# Patient Record
Sex: Male | Born: 1960 | Hispanic: No | Marital: Married | Smoking: Never smoker
Health system: Southern US, Community
[De-identification: ages and names within clinical notes are randomized; demographics above are authoritative.]

## PROBLEM LIST (undated history)

## (undated) DIAGNOSIS — E785 Hyperlipidemia, unspecified: Secondary | ICD-10-CM

## (undated) DIAGNOSIS — H269 Unspecified cataract: Secondary | ICD-10-CM

## (undated) DIAGNOSIS — T7840XA Allergy, unspecified, initial encounter: Secondary | ICD-10-CM

## (undated) DIAGNOSIS — I10 Essential (primary) hypertension: Secondary | ICD-10-CM

## (undated) HISTORY — DX: Unspecified cataract: H26.9

## (undated) HISTORY — DX: Hyperlipidemia, unspecified: E78.5

## (undated) HISTORY — DX: Allergy, unspecified, initial encounter: T78.40XA

## (undated) HISTORY — DX: Essential (primary) hypertension: I10

---

## 2003-06-30 ENCOUNTER — Ambulatory Visit (HOSPITAL_COMMUNITY): Admission: RE | Admit: 2003-06-30 | Discharge: 2003-06-30 | Payer: Self-pay | Admitting: *Deleted

## 2012-06-26 DIAGNOSIS — N529 Male erectile dysfunction, unspecified: Secondary | ICD-10-CM | POA: Insufficient documentation

## 2013-03-30 DIAGNOSIS — E669 Obesity, unspecified: Secondary | ICD-10-CM | POA: Insufficient documentation

## 2013-03-30 DIAGNOSIS — I10 Essential (primary) hypertension: Secondary | ICD-10-CM | POA: Insufficient documentation

## 2013-03-30 DIAGNOSIS — H35039 Hypertensive retinopathy, unspecified eye: Secondary | ICD-10-CM | POA: Insufficient documentation

## 2013-03-31 DIAGNOSIS — E782 Mixed hyperlipidemia: Secondary | ICD-10-CM | POA: Insufficient documentation

## 2013-04-07 DIAGNOSIS — J309 Allergic rhinitis, unspecified: Secondary | ICD-10-CM | POA: Insufficient documentation

## 2015-05-19 MED FILL — LOSARTAN POTASSIUM 100 MG T: 100 | 30 days supply | Qty: 30 | Fill #1

## 2015-05-19 MED FILL — PRAVASTATIN NA 40 MG TAB: 40 | 90 days supply | Qty: 90 | Fill #1

## 2015-05-19 MED FILL — FENOFIBRATE 145 MG TABLET: 145 | 90 days supply | Qty: 90 | Fill #1

## 2015-06-27 MED FILL — LOSARTAN POTASSIUM 100 MG T: 100 | 90 days supply | Qty: 90 | Fill #0

## 2015-06-27 MED FILL — OMEGA-3 ETHYL ESTERS 1 GM C: 1 | 90 days supply | Qty: 180 | Fill #1

## 2015-06-29 DIAGNOSIS — Z1159 Encounter for screening for other viral diseases: Secondary | ICD-10-CM | POA: Diagnosis not present

## 2015-06-29 DIAGNOSIS — E782 Mixed hyperlipidemia: Secondary | ICD-10-CM | POA: Diagnosis not present

## 2015-06-29 DIAGNOSIS — I10 Essential (primary) hypertension: Secondary | ICD-10-CM | POA: Diagnosis not present

## 2015-06-29 DIAGNOSIS — Z125 Encounter for screening for malignant neoplasm of prostate: Secondary | ICD-10-CM | POA: Diagnosis not present

## 2015-06-29 DIAGNOSIS — H35039 Hypertensive retinopathy, unspecified eye: Secondary | ICD-10-CM | POA: Diagnosis not present

## 2015-06-29 DIAGNOSIS — Z1211 Encounter for screening for malignant neoplasm of colon: Secondary | ICD-10-CM | POA: Diagnosis not present

## 2015-06-29 MED FILL — VIAGRA 100 MG TABLET: 100 | 15 days supply | Qty: 3 | Fill #1

## 2015-06-29 MED FILL — AMLODIPINE BESYLATE 10 MG T: 10 | 90 days supply | Qty: 90 | Fill #0

## 2015-08-03 MED FILL — VIAGRA 100 MG TABLET: 100 | 60 days supply | Qty: 12 | Fill #0

## 2015-09-20 DIAGNOSIS — I10 Essential (primary) hypertension: Secondary | ICD-10-CM | POA: Diagnosis not present

## 2015-09-20 DIAGNOSIS — E782 Mixed hyperlipidemia: Secondary | ICD-10-CM | POA: Diagnosis not present

## 2015-09-20 DIAGNOSIS — J301 Allergic rhinitis due to pollen: Secondary | ICD-10-CM | POA: Diagnosis not present

## 2015-09-20 DIAGNOSIS — N529 Male erectile dysfunction, unspecified: Secondary | ICD-10-CM | POA: Diagnosis not present

## 2015-09-20 DIAGNOSIS — Z1211 Encounter for screening for malignant neoplasm of colon: Secondary | ICD-10-CM | POA: Diagnosis not present

## 2015-09-20 MED FILL — ALL DAY ALLERGY 10 MG TAB: 10 | 100 days supply | Qty: 100 | Fill #0

## 2015-09-20 MED FILL — LOSARTAN POTASSIUM 100 MG T: 100 | 90 days supply | Qty: 90 | Fill #0

## 2015-09-20 MED FILL — FENOFIBRATE 145 MG TABLET: 145 | 90 days supply | Qty: 90 | Fill #0

## 2015-09-20 MED FILL — PRAVASTATIN NA 40 MG TAB: 40 | 90 days supply | Qty: 90 | Fill #0

## 2015-09-20 MED FILL — VIAGRA 100 MG TABLET: 100 | 60 days supply | Qty: 12 | Fill #0

## 2016-03-05 MED FILL — LOSARTAN POTASSIUM 100 MG T: 100 | 90 days supply | Qty: 90 | Fill #1

## 2016-03-05 MED FILL — AMLODIPINE BESYLATE 10 MG T: 10 | 90 days supply | Qty: 90 | Fill #1

## 2016-03-18 ENCOUNTER — Ambulatory Visit (INDEPENDENT_AMBULATORY_CARE_PROVIDER_SITE_OTHER): Payer: 59 | Admitting: Family Medicine

## 2016-03-18 ENCOUNTER — Ambulatory Visit (INDEPENDENT_AMBULATORY_CARE_PROVIDER_SITE_OTHER): Payer: 59

## 2016-03-18 VITALS — BP 152/94 | HR 84 | Temp 98.4°F | Resp 18 | Ht 64.0 in | Wt 179.0 lb

## 2016-03-18 DIAGNOSIS — R05 Cough: Secondary | ICD-10-CM

## 2016-03-18 DIAGNOSIS — R911 Solitary pulmonary nodule: Secondary | ICD-10-CM

## 2016-03-18 DIAGNOSIS — J40 Bronchitis, not specified as acute or chronic: Secondary | ICD-10-CM | POA: Diagnosis not present

## 2016-03-18 DIAGNOSIS — R053 Chronic cough: Secondary | ICD-10-CM

## 2016-03-18 DIAGNOSIS — R0602 Shortness of breath: Secondary | ICD-10-CM | POA: Diagnosis not present

## 2016-03-18 MED ORDER — HYDROCOD POLST-CPM POLST ER 10-8 MG/5ML PO SUER: 5.0000 mL | Freq: Two times a day (BID) | ORAL | 0 refills | Status: DC | PRN

## 2016-03-18 MED ORDER — AMOXICILLIN-POT CLAVULANATE 875-125 MG PO TABS: 1.0000 | ORAL_TABLET | Freq: Two times a day (BID) | ORAL | 0 refills | Status: DC

## 2016-03-18 MED FILL — HYDROCODONE-CHLORPHEN ER SU: 10-8 | 10 days supply | Qty: 100 | Fill #0

## 2016-03-18 MED FILL — AMOX-CLAV 875-125 MG TABLET: 875-125 | 10 days supply | Qty: 20 | Fill #0

## 2016-03-18 NOTE — Progress Notes (Signed)
Patient ID: Cody Lindsey, male    DOB: [birth year], 56 y.o.   MRN: [id number]  PCP: No primary care provider on file.  Chief Complaint  Patient presents with  . Cough    persistent cough x 1 month. Productive cough just recently     Subjective:  HPI 56 year olds male presents for evaluation of cough x 1 month. Spouse is providing hx of present illness along with husband. Spouse reports that return from a [place] to [place] in early January, pt developed a cough which has progressively worsened over this month. Patient reports at least once annually he develops a really bad cough that lasts for several weeks and he questions whether or he has allergies and would like to follow-up with an Allergists. Wife reports his breathing and wheezing worsens at night to the point, she can hear him coughing and wheezing in another room. He works outside in [employer] and is around outdoor triggers in addition to weather elements. Reports entire body aches over the last few days. Barking cough. He has been taking Thera-Flu, Allegra D, and Flonase nasal spray with minimal relief of symptoms. Increased fatigue due to not sleeping as a result of persistent cough.  Social History   Social History  . Marital status: Married    Spouse name: N/A  . Number of children: N/A  . Years of education: N/A   Occupational History  . Not on file.   Social History Main Topics  . Smoking status: Never Smoker  . Smokeless tobacco: Never Used  . Alcohol use Not on file  . Drug use: Unknown  . Sexual activity: Not on file   Other Topics Concern  . Not on file   Social History Narrative  . No narrative on file   Review of Systems See HPI  Prior to Admission medications   Medication Sig Start Date End Date Taking? Authorizing Provider  amlodipine-atorvastatin (CADUET) 10-10 MG tablet Take 1 tablet by mouth daily.   Yes Historical Provider, MD  aspirin 81 MG chewable tablet Chew by mouth daily.   Yes  Historical Provider, MD  fenofibrate 54 MG tablet Take 54 mg by mouth daily.   Yes Historical Provider, MD  fexofenadine-pseudoephedrine (ALLEGRA-D) 60-120 MG 12 hr tablet Take 1 tablet by mouth 2 (two) times daily.   Yes Historical Provider, MD  losartan (COZAAR) 50 MG tablet Take 50 mg by mouth daily.   Yes Historical Provider, MD  omega-3 acid ethyl esters (LOVAZA) 1 g capsule Take by mouth 2 (two) times daily.   Yes Historical Provider, MD  pravastatin (PRAVACHOL) 10 MG tablet Take 10 mg by mouth daily.   Yes Historical Provider, MD  lisinopril (PRINIVIL,ZESTRIL) 10 MG tablet Take 10 mg by mouth daily.    Historical Provider, MD    Past Medical, Surgical Family and Social History reviewed and updated.    Objective:   Today's Vitals   03/18/16 1450  BP: (!) 166/94  Pulse: 84  Resp: 18  Temp: 98.4 F (36.9 C)  TempSrc: Oral  SpO2: 97%  Weight: 179 lb (81.2 kg)  Height: 5\' 4"  (1.626 m)    Wt Readings from Last 3 Encounters:  03/18/16 179 lb (81.2 kg)   Physical Exam  Constitutional: He is oriented to person, place, and time. He appears well-developed and well-nourished.  HENT:  Head: Normocephalic and atraumatic.  Right Ear: External ear normal.  Left Ear: External ear normal.  Mouth/Throat: Oropharynx is clear and moist.  Eyes: Conjunctivae and EOM are normal. Pupils are equal, round, and reactive to light.  Neck: Normal range of motion. Neck supple.  Cardiovascular: Normal rate, regular rhythm, normal heart sounds and intact distal pulses.   Pulmonary/Chest: Effort normal. He has decreased breath sounds in the right upper field, the right middle field, the left upper field and the left middle field. He has no wheezes. He has no rhonchi. He has no rales. He exhibits no tenderness.  Neurological: He is alert and oriented to person, place, and time.  Skin: Skin is warm and dry.  Psychiatric: He has a normal mood and affect. His behavior is normal. Judgment and thought  content normal.    Assessment & Plan:  1. Bronchitis Plan: - Ambulatory referral to Allergy - DG Chest 2 View -Augmentin 875-125 mg, twice daily x 10 days. -Tussionex 5 ml every 12 hours   2. Lung nodule - CT Chest W Contrast, further evaluation of a nodule see on chest xray in right middle lobe. After CT scan completed, will forward to pulmonology for further evaluation.  Godfrey Pick. Tiburcio Pea, MSN, FNP-C Primary Care at [facility] [phone]

## 2016-03-18 NOTE — Patient Instructions (Addendum)
Take Tussionex 5 ml every 12 hours as needed for cough.  Start Augmentin 1 tablet twice daily with food to avoid stomach upset x 10 days. Complete all medication.  I have ordered a CT scan of your chest to follow-up on the a nodule which was seen on chest x-ray today in the right middle lobe. You will be contacted regarding the appointment for the CT scan.    IF you received an x-ray today, you will receive an invoice from [facility]. Please contact [facility] at [phone] with questions or concerns regarding your invoice.   IF you received labwork today, you will receive an invoice from [facility]. Please contact [facility] at [phone] with questions or concerns regarding your invoice.   Our billing staff will not be able to assist you with questions regarding bills from these companies.  You will be contacted with the lab results as soon as they are available. The fastest way to get your results is to activate your My Chart account. Instructions are located on the last page of this paperwork. If you have not heard from us regarding the results in 2 weeks, please contact this office.     Acute Bronchitis, Adult Acute bronchitis is when air tubes (bronchi) in the lungs suddenly get swollen. The condition can make it hard to breathe. It can also cause these symptoms:  A cough.  Coughing up clear, yellow, or green mucus.  Wheezing.  Chest congestion.  Shortness of breath.  A fever.  Body aches.  Chills.  A sore throat. Follow these instructions at home: Medicines  Take over-the-counter and prescription medicines only as told by your doctor.  If you were prescribed an antibiotic medicine, take it as told by your doctor. Do not stop taking the antibiotic even if you start to feel better. General instructions  Rest.  Drink enough fluids to keep your pee (urine) clear or pale yellow.  Avoid smoking and secondhand smoke. If you smoke and you need  help quitting, ask your doctor. Quitting will help your lungs heal faster.  Use an inhaler, cool mist vaporizer, or humidifier as told by your doctor.  Keep all follow-up visits as told by your doctor. This is important. How is this prevented? To lower your risk of getting this condition again:  Wash your hands often with soap and water. If you cannot use soap and water, use hand sanitizer.  Avoid contact with people who have cold symptoms.  Try not to touch your hands to your mouth, nose, or eyes.  Make sure to get the flu shot every year. Contact a doctor if:  Your symptoms do not get better in 2 weeks. Get help right away if:  You cough up blood.  You have chest pain.  You have very bad shortness of breath.  You become dehydrated.  You faint (pass out) or keep feeling like you are going to pass out.  You keep throwing up (vomiting).  You have a very bad headache.  Your fever or chills gets worse. This information is not intended to replace advice given to you by your health care provider. Make sure you discuss any questions you have with your health care provider. Document Released: 07/24/2007 Document Revised: 09/13/2015 Document Reviewed: 07/26/2015 Elsevier Interactive Patient Education  2017 [employer]

## 2016-03-26 DIAGNOSIS — Z0000 Encounter for general adult medical examination without abnormal findings: Secondary | ICD-10-CM | POA: Diagnosis not present

## 2016-03-26 DIAGNOSIS — H35039 Hypertensive retinopathy, unspecified eye: Secondary | ICD-10-CM | POA: Diagnosis not present

## 2016-03-26 DIAGNOSIS — E782 Mixed hyperlipidemia: Secondary | ICD-10-CM | POA: Diagnosis not present

## 2016-03-26 DIAGNOSIS — Z1211 Encounter for screening for malignant neoplasm of colon: Secondary | ICD-10-CM | POA: Diagnosis not present

## 2016-03-26 DIAGNOSIS — I10 Essential (primary) hypertension: Secondary | ICD-10-CM | POA: Diagnosis not present

## 2016-03-26 MED FILL — ALL DAY ALLERGY 10 MG TAB: 10 | 100 days supply | Qty: 100 | Fill #0

## 2016-03-28 MED FILL — PRAVASTATIN NA 40 MG TAB: 40 | 90 days supply | Qty: 90 | Fill #0

## 2016-03-28 MED FILL — FENOFIBRATE 145 MG TABLET: 145 | 90 days supply | Qty: 90 | Fill #0

## 2016-03-28 MED FILL — OMEGA-3 ETHYL ESTERS 1 GM C: 1 | 90 days supply | Qty: 180 | Fill #0

## 2016-04-05 ENCOUNTER — Encounter (HOSPITAL_COMMUNITY): Payer: Self-pay

## 2016-04-05 ENCOUNTER — Ambulatory Visit (HOSPITAL_COMMUNITY)
Admission: RE | Admit: 2016-04-05 | Discharge: 2016-04-05 | Disposition: A | Discharge status: Home / Self Care | Payer: 59 | Source: Ambulatory Visit | Attending: Family Medicine | Admitting: Family Medicine

## 2016-04-05 DIAGNOSIS — M5134 Other intervertebral disc degeneration, thoracic region: Secondary | ICD-10-CM | POA: Insufficient documentation

## 2016-04-05 DIAGNOSIS — K760 Fatty (change of) liver, not elsewhere classified: Secondary | ICD-10-CM | POA: Diagnosis not present

## 2016-04-05 DIAGNOSIS — R911 Solitary pulmonary nodule: Secondary | ICD-10-CM

## 2016-04-05 DIAGNOSIS — K8020 Calculus of gallbladder without cholecystitis without obstruction: Secondary | ICD-10-CM | POA: Insufficient documentation

## 2016-04-05 DIAGNOSIS — R93422 Abnormal radiologic findings on diagnostic imaging of left kidney: Secondary | ICD-10-CM | POA: Diagnosis not present

## 2016-04-05 MED ORDER — IOPAMIDOL (ISOVUE-300) INJECTION 61%
75.0000 mL | Freq: Once | INTRAVENOUS | Status: AC | PRN
  Administered 2016-04-05: 75 mL via INTRAVENOUS

## 2016-04-05 MED ORDER — IOPAMIDOL (ISOVUE-300) INJECTION 61%
INTRAVENOUS | Status: AC
  Filled 2016-04-05: qty 75

## 2016-04-05 MED ORDER — SODIUM CHLORIDE 0.9 % IJ SOLN
INTRAMUSCULAR | Status: AC
  Filled 2016-04-05: qty 50

## 2016-04-09 ENCOUNTER — Encounter: Payer: Self-pay | Admitting: Family Medicine

## 2016-04-19 DIAGNOSIS — R918 Other nonspecific abnormal finding of lung field: Secondary | ICD-10-CM | POA: Diagnosis not present

## 2016-04-19 DIAGNOSIS — R7303 Prediabetes: Secondary | ICD-10-CM | POA: Diagnosis not present

## 2016-04-19 DIAGNOSIS — E782 Mixed hyperlipidemia: Secondary | ICD-10-CM | POA: Diagnosis not present

## 2016-04-19 DIAGNOSIS — I10 Essential (primary) hypertension: Secondary | ICD-10-CM | POA: Diagnosis not present

## 2016-07-03 DIAGNOSIS — I10 Essential (primary) hypertension: Secondary | ICD-10-CM | POA: Diagnosis not present

## 2016-07-03 DIAGNOSIS — R918 Other nonspecific abnormal finding of lung field: Secondary | ICD-10-CM | POA: Diagnosis not present

## 2016-07-03 DIAGNOSIS — R7303 Prediabetes: Secondary | ICD-10-CM | POA: Diagnosis not present

## 2016-07-03 DIAGNOSIS — E782 Mixed hyperlipidemia: Secondary | ICD-10-CM | POA: Diagnosis not present

## 2016-07-09 MED FILL — SILDENAFIL 100 MG TABLET: 100 | 15 days supply | Qty: 3 | Fill #1

## 2016-07-16 MED FILL — LOSARTAN POTASSIUM 100 MG T: 100 | 90 days supply | Qty: 90 | Fill #0

## 2016-07-22 MED FILL — SILDENAFIL 100 MG TABLET: 100 | 30 days supply | Qty: 6 | Fill #0

## 2016-10-07 DIAGNOSIS — E782 Mixed hyperlipidemia: Secondary | ICD-10-CM | POA: Diagnosis not present

## 2016-10-07 DIAGNOSIS — I10 Essential (primary) hypertension: Secondary | ICD-10-CM | POA: Diagnosis not present

## 2016-10-07 DIAGNOSIS — R7303 Prediabetes: Secondary | ICD-10-CM | POA: Diagnosis not present

## 2016-10-07 DIAGNOSIS — R918 Other nonspecific abnormal finding of lung field: Secondary | ICD-10-CM | POA: Diagnosis not present

## 2016-10-07 MED FILL — FENOFIBRATE 145 MG TAB: 145 | 90 days supply | Qty: 90 | Fill #0

## 2016-10-07 MED FILL — PRAVASTATIN NA 40 MG TAB: 40 | 90 days supply | Qty: 90 | Fill #0

## 2016-10-07 MED FILL — LOSARTAN POTASSIUM 100 MG T: 100 | 90 days supply | Qty: 90 | Fill #0

## 2016-10-07 MED FILL — OMEGA-3 ETHYL ESTERS 1 GM C: 1 | 90 days supply | Qty: 360 | Fill #0

## 2016-11-26 MED FILL — SILDENAFIL CITRATE 100 MG T: 100 | 30 days supply | Qty: 6 | Fill #1

## 2017-01-16 MED FILL — SILDENAFIL CITRATE 100 MG T: 100 | 15 days supply | Qty: 3 | Fill #2

## 2017-01-16 MED FILL — LOSARTAN POTASSIUM 100 MG T: 100 | 90 days supply | Qty: 90 | Fill #1

## 2017-02-06 DIAGNOSIS — I10 Essential (primary) hypertension: Secondary | ICD-10-CM | POA: Diagnosis not present

## 2017-02-06 DIAGNOSIS — R918 Other nonspecific abnormal finding of lung field: Secondary | ICD-10-CM | POA: Diagnosis not present

## 2017-02-06 DIAGNOSIS — R7303 Prediabetes: Secondary | ICD-10-CM | POA: Diagnosis not present

## 2017-02-06 DIAGNOSIS — E782 Mixed hyperlipidemia: Secondary | ICD-10-CM | POA: Diagnosis not present

## 2017-02-06 MED FILL — AMLODIPINE 2.5 MG TABLET: 2.5 | 90 days supply | Qty: 90 | Fill #0

## 2017-04-23 MED FILL — FENOFIBRATE 145 MG TAB: 145 | 90 days supply | Qty: 90 | Fill #1

## 2017-04-23 MED FILL — PRAVASTATIN NA 40 MG TAB: 40 | 90 days supply | Qty: 90 | Fill #1

## 2017-04-23 MED FILL — OMEGA-3 ETHYL ESTERS 1 GM C: 1 | 90 days supply | Qty: 360 | Fill #1

## 2017-04-23 MED FILL — LOSARTAN POTASSIUM 100 MG T: 100 | 90 days supply | Qty: 90 | Fill #2

## 2017-07-23 MED FILL — LOSARTAN POTASSIUM 100 MG T: 100 | 90 days supply | Qty: 90 | Fill #3

## 2017-08-13 IMAGING — DX DG CHEST 2V
2 series · 2 of 2 positions shown · non-contrast
Comparison: None.

CLINICAL DATA: Shortness of breath and cough.

EXAM:
CHEST  2 VIEW

[chest lat]
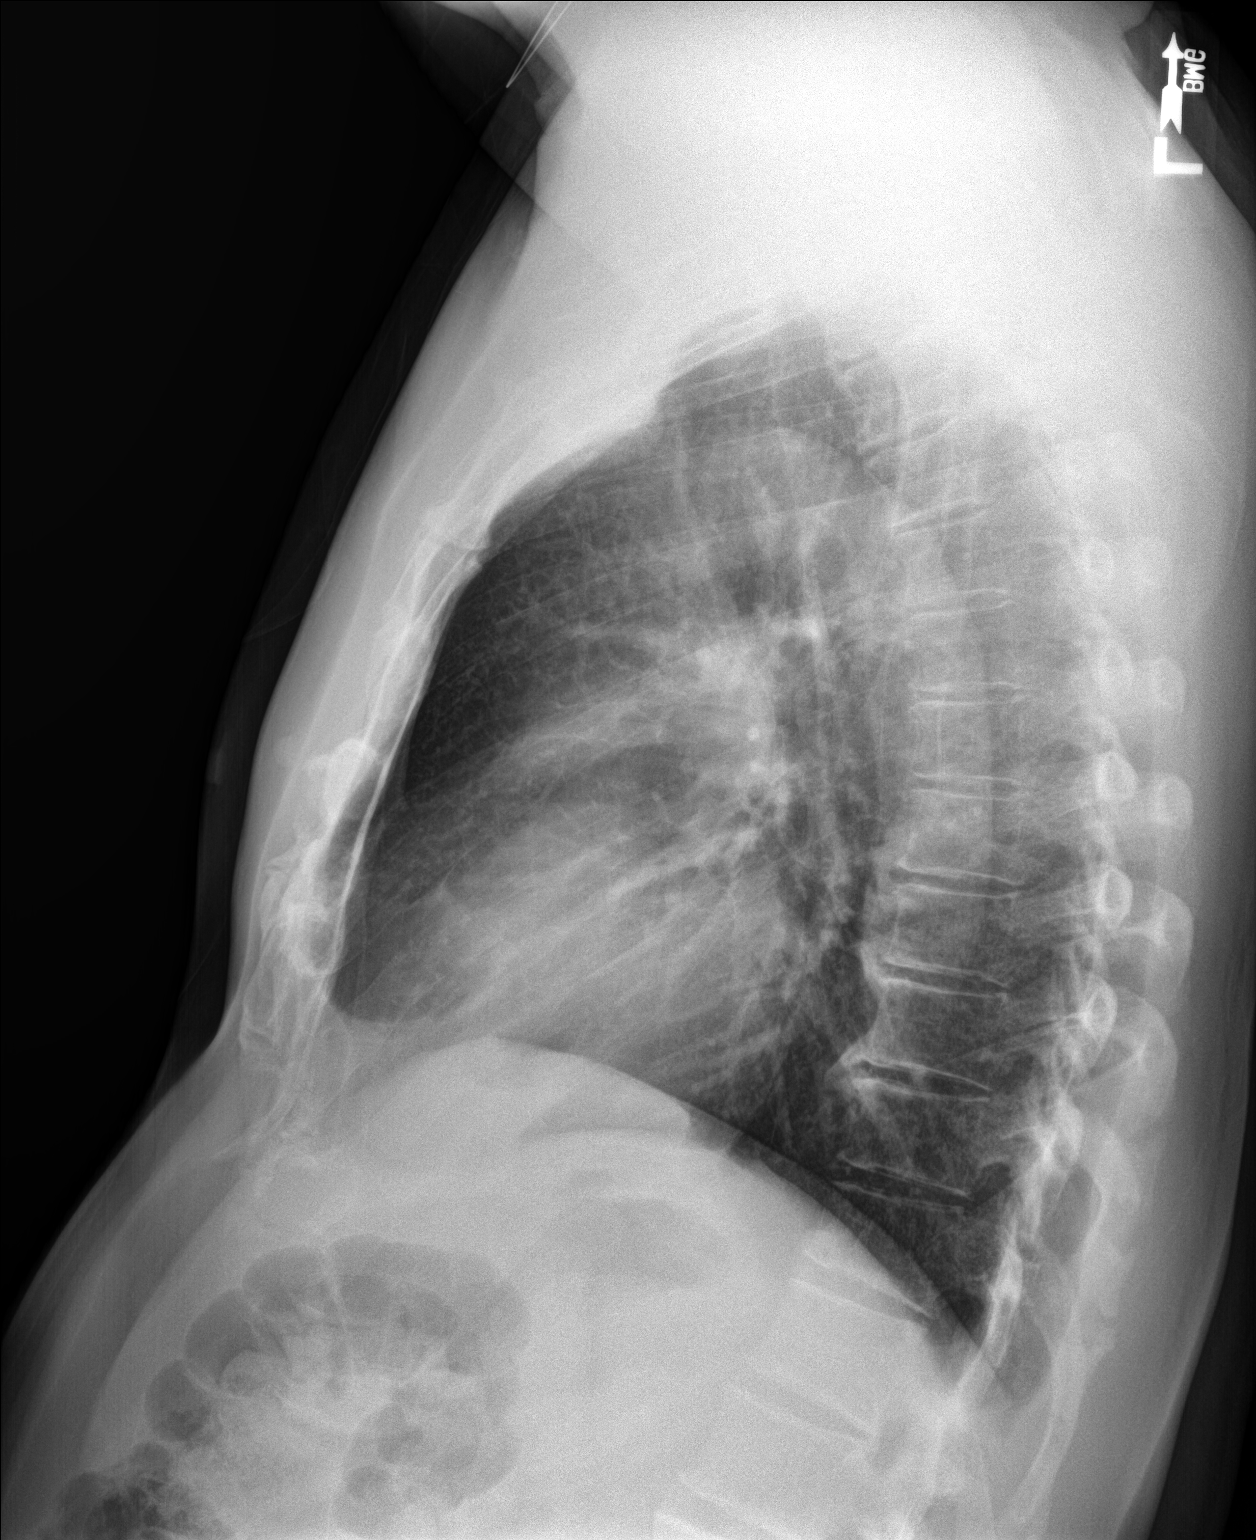

[chest pa]
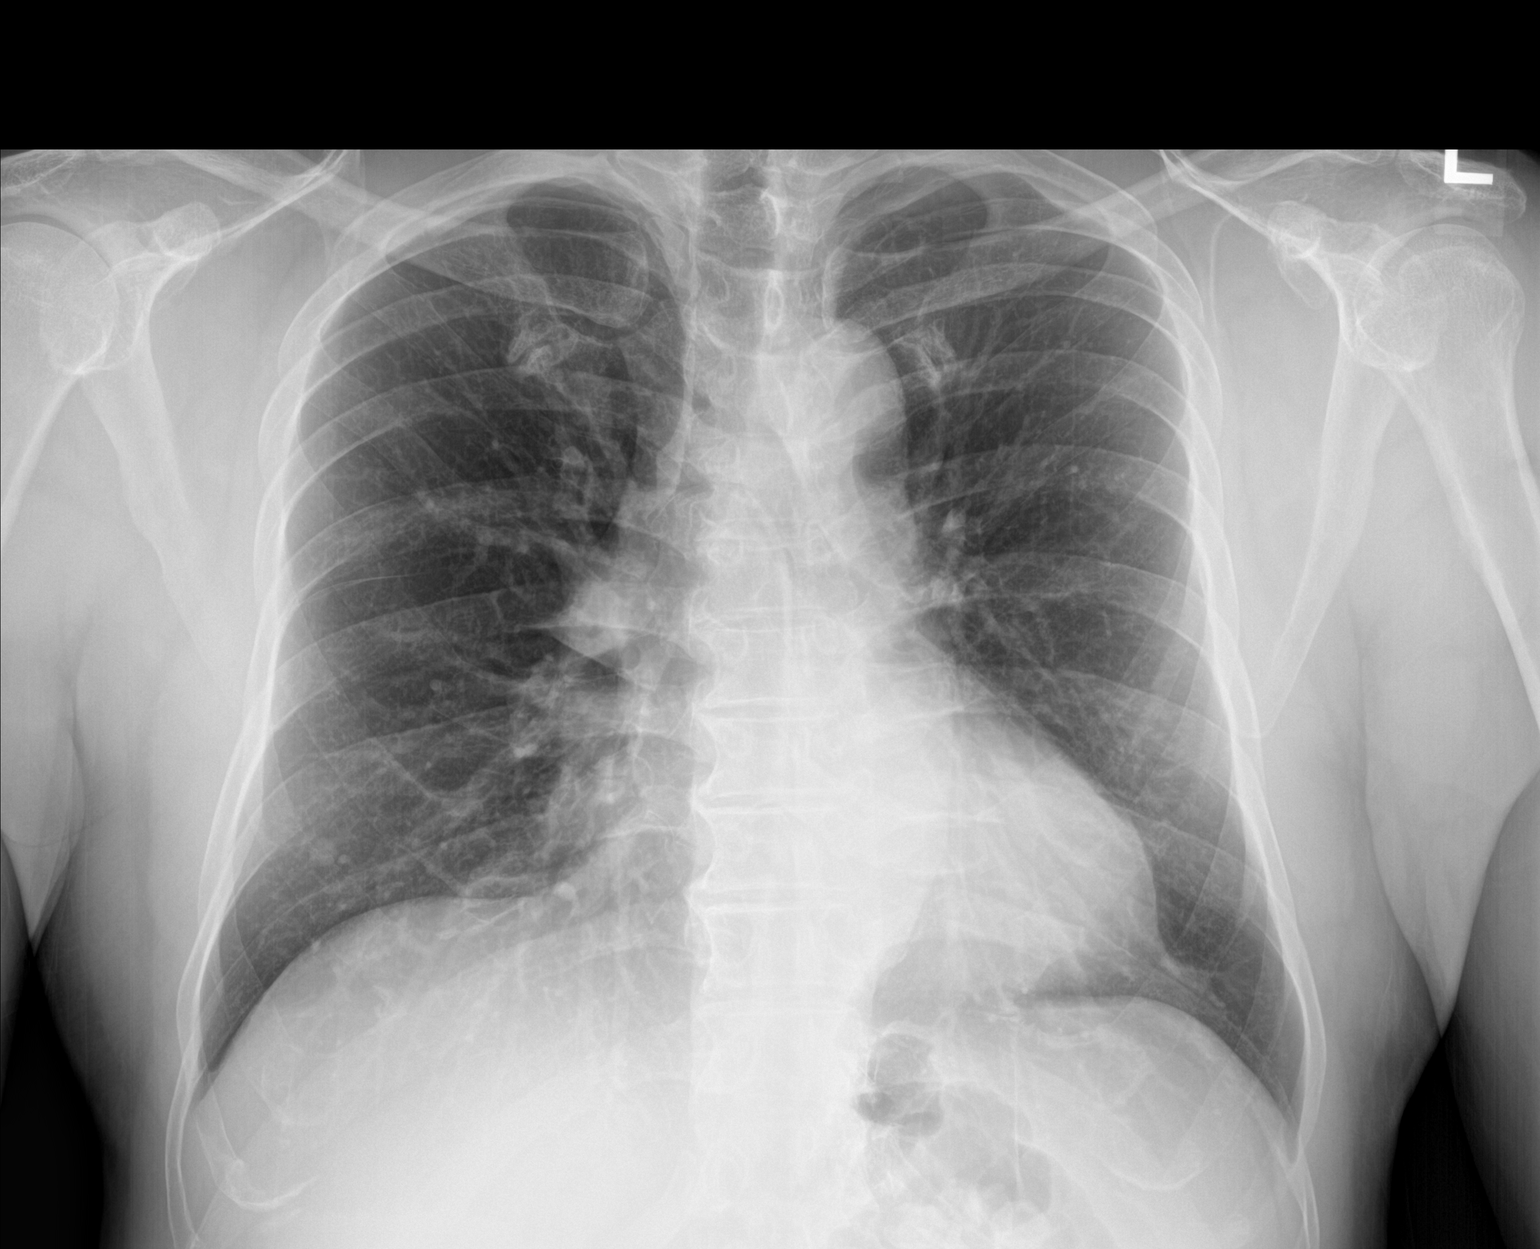

[2 of 2 positions shown; findings below may reference images not displayed]

FINDINGS: Cardiac silhouette is normal in size. No mediastinal or hilar
masses. No evidence of adenopathy.

There is a small nodule projecting in the right middle lobe. Lungs
are otherwise clear.

No pleural effusion or pneumothorax.

Skeletal structures are intact.
IMPRESSION: 1. No acute cardiopulmonary disease.
2. Small right middle lobe nodule. Recommend follow-up chest CT for
further assessment.

## 2017-08-31 IMAGING — CT CT CHEST W/ CM
2 of 3 series · 15 of 36 positions shown, 18 images · IV contrast (iopamidol)
Comparison: Chest x-ray 03/18/2016

CLINICAL DATA: Abnormal chest x-ray possible lung nodule in right
middle lobe.

EXAM:
CT CHEST WITH CONTRAST
TECHNIQUE: Multidetector CT imaging of the chest was performed during
intravenous contrast administration.
CONTRAST:  75mL FYNAYX-7MM IOPAMIDOL (FYNAYX-7MM) INJECTION 61%

[Series 2: axial st · axial · 0.69mm/px · z∈[-250,+12]mm · 12 of 155 slices shown, 15 images]
[im 12/155  mediastinal]
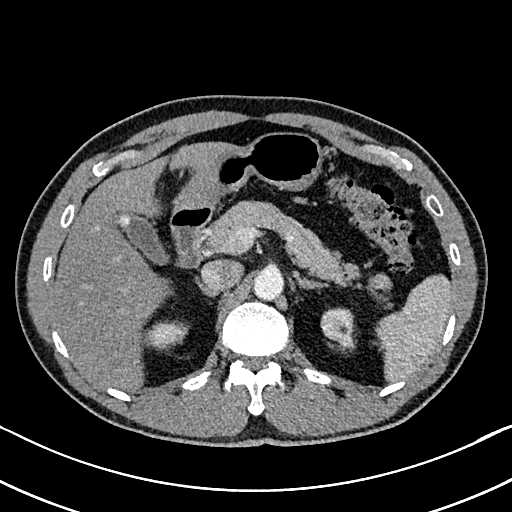
[im 12/155  lung]
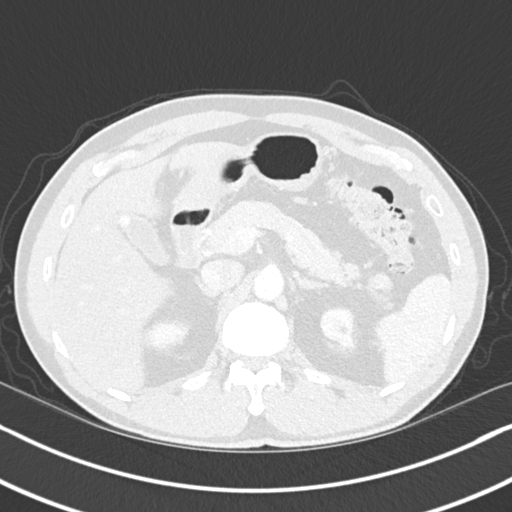
[im 23/155  lung]
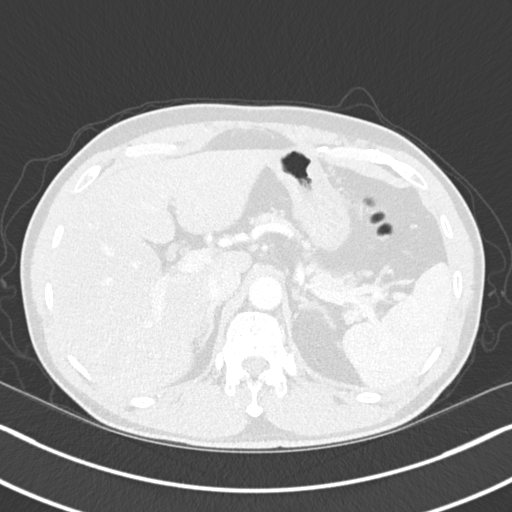
[im 35/155  lung]
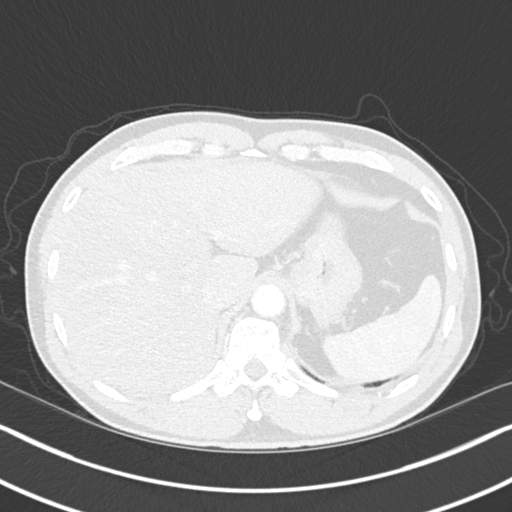
[im 46/155  lung]
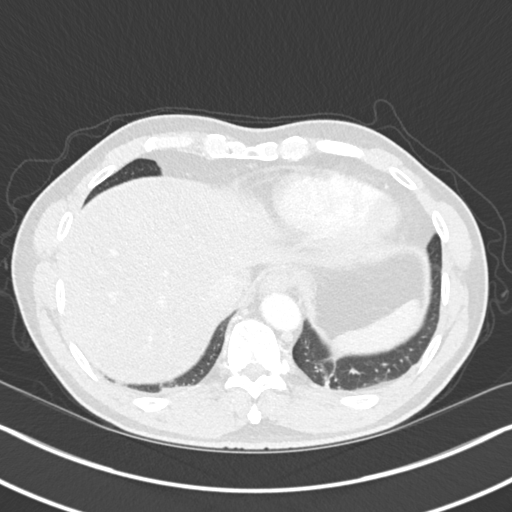
[im 58/155  mediastinal]
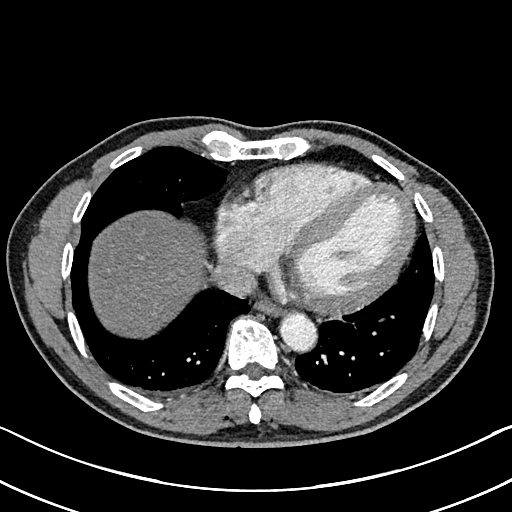
[im 58/155  lung]
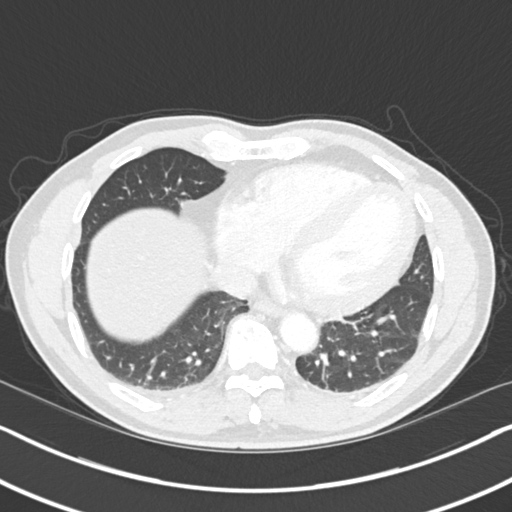
[im 69/155  lung]
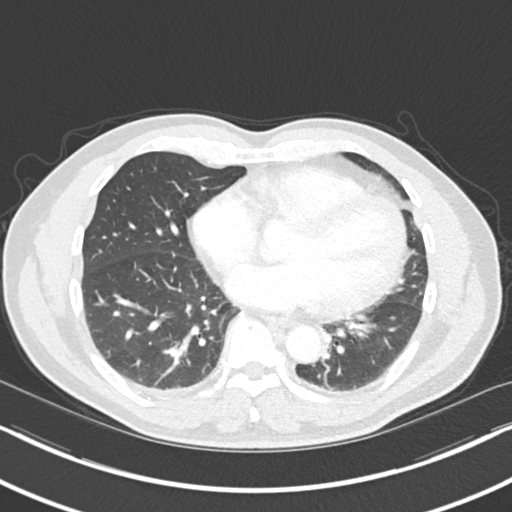
[im 86/155  lung]
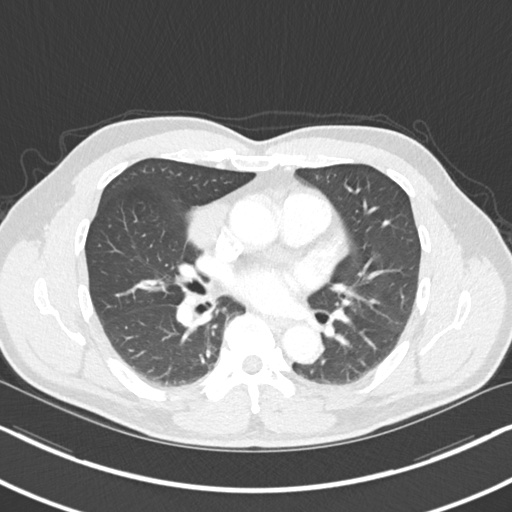
[im 97/155  lung]
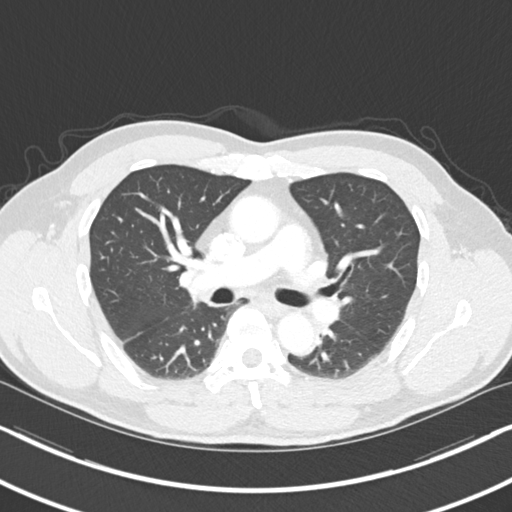
[im 109/155  mediastinal]
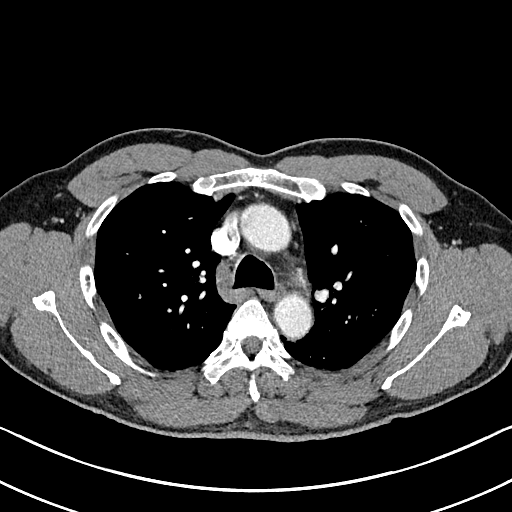
[im 109/155  lung]
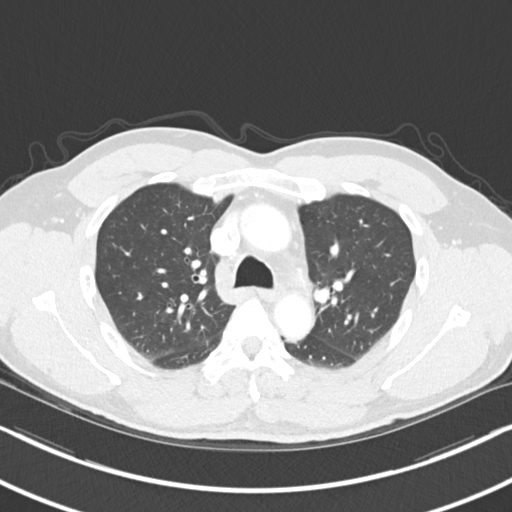
[im 120/155  lung]
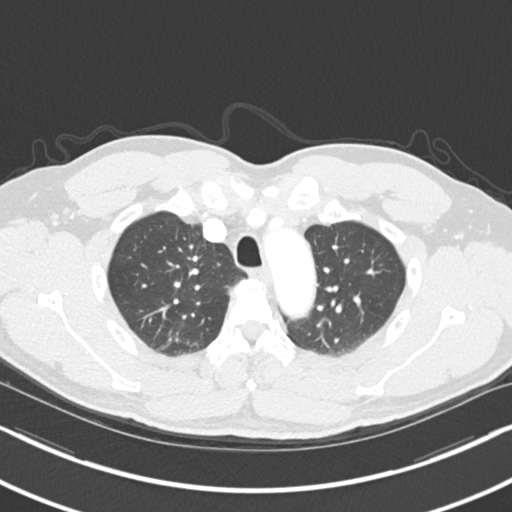
[im 132/155  lung]
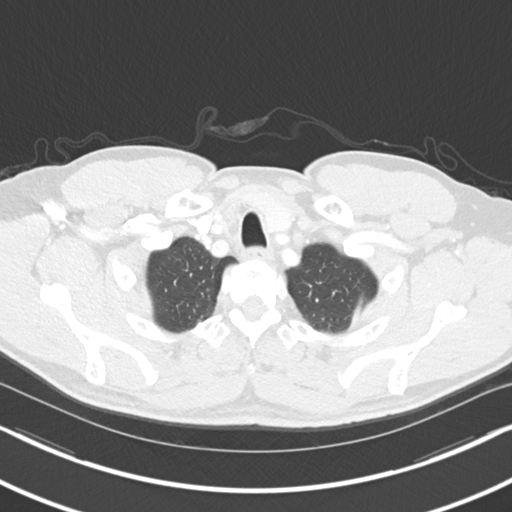
[im 143/155  lung]
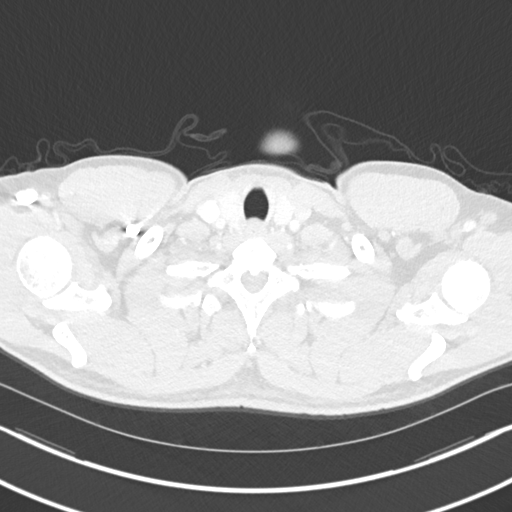

[Series 6: coronal · coronal · 0.62mm/px · 3 of 129 slices shown]
[im 26/129  lung]
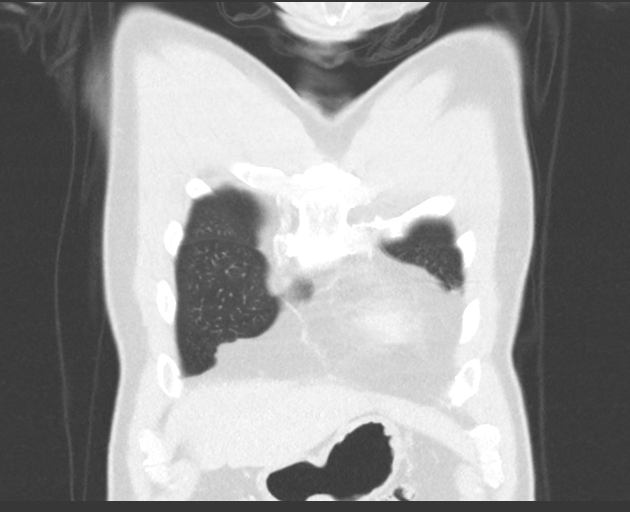
[im 52/129  lung]
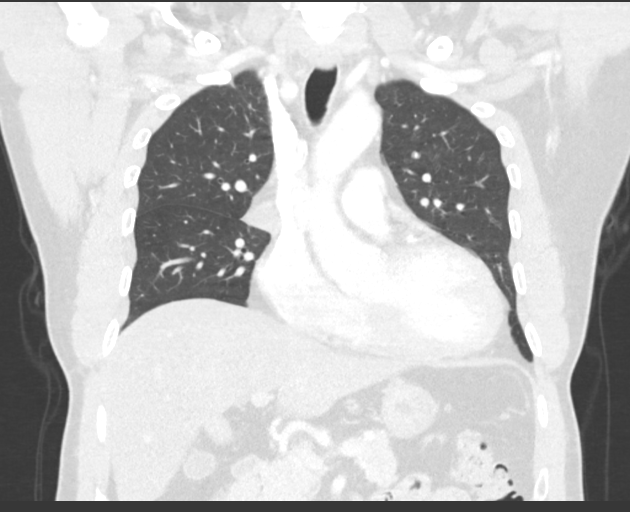
[im 77/129  lung]
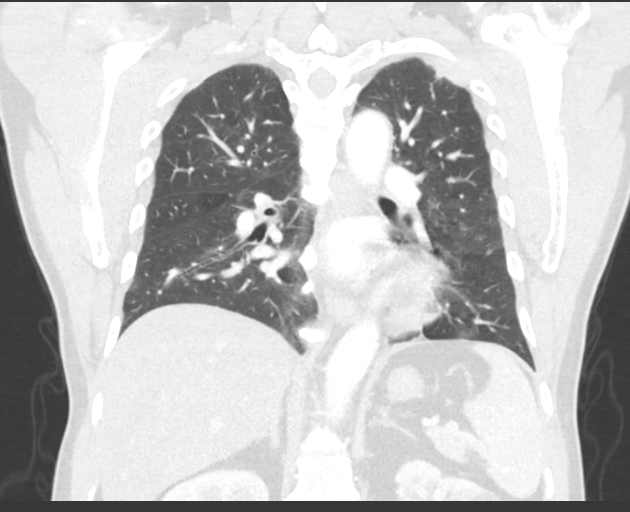

[15 of 36 positions shown; findings below may reference images not displayed]

FINDINGS: Cardiovascular: Central pulmonary artery and thoracic aorta is
unremarkable. Minimal atherosclerotic calcifications of left
coronary artery. Heart size within normal limits. No pericardial
effusion. No aortic aneurysm.

Mediastinum/Nodes: No mediastinal hematoma or adenopathy. No hilar
adenopathy is noted. Central airways are patent.

Lungs/Pleura: Images of the lung parenchyma shows no infiltrate or
pulmonary edema. No bronchiectasis. No fibrotic changes. No
emphysema. There is no evidence of mass or nodule in right middle
lobe. Question nipple shadow on chest x-ray. No pneumothorax.
Minimal dependent atelectasis bilateral lung bases posteriorly.

Upper Abdomen: There is mild fatty infiltration of the liver. No
focal hepatic mass. The visualized pancreas spleen and adrenal
glands are unremarkable. There is a calcified gallstone within
fundus of the gallbladder measures 1.1 cm. Partially visualized cyst
in upper pole of the left kidney measures 2 cm.

Musculoskeletal: No destructive bony lesions are noted. Sagittal
images of the spine shows multilevel degenerative changes thoracic
spine with mild to moderate anterior spurring. Sagittal view of the
sternum is unremarkable. No destructive bony lesions are noted.
IMPRESSION: 1. No infiltrate or pulmonary edema. No evidence of pulmonary nodule
or mass in right middle lobe.
2. No mediastinal hematoma or adenopathy.  Patent central airways.
3. Mild degenerative changes thoracic spine.
4. There is a calcified gallstone in gallbladder fundal region
measures 1.1 cm. Mild fatty infiltration of the liver.
5. Probable cyst in upper pole of the left kidney partially
visualized measures 2 cm.

## 2017-10-06 MED FILL — AMLODIPINE 2.5 MG TABLET: 2.5 | 90 days supply | Qty: 90 | Fill #1

## 2017-10-06 MED FILL — PRAVASTATIN NA 40 MG TAB: 40 | 90 days supply | Qty: 90 | Fill #2

## 2017-10-06 MED FILL — FENOFIBRATE 145 MG TAB: 145 | 90 days supply | Qty: 90 | Fill #2

## 2017-10-10 MED FILL — LOSARTAN POTASSIUM 100 MG T: 100 | 90 days supply | Qty: 90 | Fill #0

## 2017-10-13 DIAGNOSIS — I10 Essential (primary) hypertension: Secondary | ICD-10-CM | POA: Diagnosis not present

## 2017-10-13 DIAGNOSIS — Z1159 Encounter for screening for other viral diseases: Secondary | ICD-10-CM | POA: Diagnosis not present

## 2017-10-13 DIAGNOSIS — Z125 Encounter for screening for malignant neoplasm of prostate: Secondary | ICD-10-CM | POA: Diagnosis not present

## 2017-10-13 DIAGNOSIS — E782 Mixed hyperlipidemia: Secondary | ICD-10-CM | POA: Diagnosis not present

## 2017-10-13 DIAGNOSIS — R7303 Prediabetes: Secondary | ICD-10-CM | POA: Diagnosis not present

## 2017-10-13 DIAGNOSIS — N529 Male erectile dysfunction, unspecified: Secondary | ICD-10-CM | POA: Diagnosis not present

## 2017-10-13 MED FILL — OMEGA-3 ETHYL ESTER 1 GM CA: 1 | 90 days supply | Qty: 360 | Fill #0

## 2017-10-13 MED FILL — SILDENAFIL CITRATE 100 MG T: 100 | 90 days supply | Qty: 18 | Fill #0

## 2017-11-13 ENCOUNTER — Encounter: Payer: Self-pay | Admitting: Urgent Care

## 2017-11-13 ENCOUNTER — Ambulatory Visit: Payer: 59 | Admitting: Urgent Care

## 2017-11-13 ENCOUNTER — Other Ambulatory Visit: Payer: Self-pay

## 2017-11-13 VITALS — BP 177/88 | HR 57 | Temp 97.9°F | Resp 18 | Ht 64.0 in | Wt 170.6 lb

## 2017-11-13 DIAGNOSIS — R030 Elevated blood-pressure reading, without diagnosis of hypertension: Secondary | ICD-10-CM | POA: Diagnosis not present

## 2017-11-13 DIAGNOSIS — S60112A Contusion of left thumb with damage to nail, initial encounter: Secondary | ICD-10-CM

## 2017-11-13 DIAGNOSIS — S6010XA Contusion of unspecified finger with damage to nail, initial encounter: Secondary | ICD-10-CM

## 2017-11-13 DIAGNOSIS — I10 Essential (primary) hypertension: Secondary | ICD-10-CM

## 2017-11-13 MED ORDER — LISINOPRIL-HYDROCHLOROTHIAZIDE 20-25 MG PO TABS: 1.0000 | ORAL_TABLET | Freq: Every day | ORAL | 3 refills | Status: DC

## 2017-11-13 MED FILL — LISINOPRIL-HCTZ 20-25 MG TA: 20-25 | 90 days supply | Qty: 90 | Fill #0

## 2017-11-13 NOTE — Patient Instructions (Addendum)
You may take 500mg  Tylenol every 6 hours for pain and inflammation.      Contusion A contusion is a deep bruise. Contusions are the result of a blunt injury to tissues and muscle fibers under the skin. The injury causes bleeding under the skin. The skin overlying the contusion may turn blue, purple, or yellow. Minor injuries will give you a painless contusion, but more severe contusions may stay painful and swollen for a few weeks. What are the causes? This condition is usually caused by a blow, trauma, or direct force to an area of the body. What are the signs or symptoms? Symptoms of this condition include:  Swelling of the injured area.  Pain and tenderness in the injured area.  Discoloration. The area may have redness and then turn blue, purple, or yellow.  How is this diagnosed? This condition is diagnosed based on a physical exam and medical history. An X-ray, CT scan, or MRI may be needed to determine if there are any associated injuries, such as broken bones (fractures). How is this treated? Specific treatment for this condition depends on what area of the body was injured. In general, the best treatment for a contusion is resting, icing, applying pressure to (compression), and elevating the injured area. This is often called the RICE strategy. Over-the-counter anti-inflammatory medicines may also be recommended for pain control. Follow these instructions at home:  Rest the injured area.  If directed, apply ice to the injured area: ? Put ice in a plastic bag. ? Place a towel between your skin and the bag. ? Leave the ice on for 20 minutes, 2-3 times per day.  If directed, apply light compression to the injured area using an elastic bandage. Make sure the bandage is not wrapped too tightly. Remove and reapply the bandage as directed by your health care provider.  If possible, raise (elevate) the injured area above the level of your heart while you are sitting or lying  down.  Take over-the-counter and prescription medicines only as told by your health care provider. Contact a health care provider if:  Your symptoms do not improve after several days of treatment.  Your symptoms get worse.  You have difficulty moving the injured area. Get help right away if:  You have severe pain.  You have numbness in a hand or foot.  Your hand or foot turns pale or cold. This information is not intended to replace advice given to you by your health care provider. Make sure you discuss any questions you have with your health care provider. Document Released: 11/14/2004 Document Revised: 06/15/2015 Document Reviewed: 06/22/2014 Elsevier Interactive Patient Education  [employer].     If you have lab work done today you will be contacted with your lab results within the next 2 weeks.  If you have not heard from [place] then please contact us. The fastest way to get your results is to register for My Chart.   IF you received an x-ray today, you will receive an invoice from [facility]. Please contact [facility] at [phone] with questions or concerns regarding your invoice.   IF you received labwork today, you will receive an invoice from [facility]. Please contact [facility] at [phone] with questions or concerns regarding your invoice.   Our billing staff will not be able to assist you with questions regarding bills from these companies.  You will be contacted with the lab results as soon as they are available. The fastest  way to get your results is to activate your My Chart account. Instructions are located on the last page of this paperwork. If you have not heard from [place] regarding the results in 2 weeks, please contact this office.

## 2017-11-13 NOTE — Progress Notes (Signed)
    MRN: [id number] DOB: 28-Feb-1960  Subjective:   Cody Lindsey is a 57 y.o. male presenting for suffering a left thumb injury Monday.  Patient states that a large piece of metal flung into his left thumb.  He had significant swelling, pain, bruising.  States that it has been getting better with over-the-counter pain killer.  Patient is worried that he needs an antibiotic.  Works with Dr. Modesto Charon with his HTN, [facility].  Denies chest pain, confusion, headache, dizziness, heart racing, hematuria.  reports that he has never smoked. He has never used smokeless tobacco.   Cody Lindsey has a current medication list which includes the following prescription(s): amlodipine-atorvastatin, aspirin, chlorpheniramine-hydrocodone, fenofibrate, fexofenadine-pseudoephedrine, losartan, omega-3 acid ethyl esters, and pravastatin. Also has No Known Allergies.  Cody Lindsey  has a past medical history of Allergy, Cataract, Hyperlipidemia, and Hypertension. Also  has no past surgical history on file.  Objective:   Vitals: BP (!) 177/88   Pulse (!) 57   Temp 97.9 F (36.6 C) (Oral)   Resp 18   Ht 5\' 4"  (1.626 m)   Wt 170 lb 9.6 oz (77.4 kg)   SpO2 98%   BMI 29.28 kg/m   BP Readings from Last 3 Encounters:  11/13/17 (!) 177/88  03/18/16 (!) 152/94    Physical Exam  Constitutional: He is oriented to person, place, and time. He appears well-developed and well-nourished.  Cardiovascular: Normal rate, regular rhythm, normal heart sounds and intact distal pulses. Exam reveals no gallop and no friction rub.  No murmur heard. Pulmonary/Chest: Effort normal and breath sounds normal. No stridor. No respiratory distress. He has no wheezes. He has no rales.  Musculoskeletal:       Left hand: He exhibits decreased range of motion (full flexion extension of left thumb), tenderness (very mild over DIP left thumb) and swelling (diffuse from DIP to end of left thumb). He exhibits no bony tenderness, normal capillary refill  and no deformity. Normal sensation noted. Normal strength noted.  Neurological: He is alert and oriented to person, place, and time.    Assessment and Plan :   Contusion of left thumb with damage to nail, initial encounter  Contusion of fingernail, initial encounter  Essential hypertension  Elevated blood pressure reading  Patient refused x-ray of his thumb despite the fact that I explained he could very well have a thumb fracture given the extent of his swelling and bruising.  I counseled that he did not need an antibiotic for this given that there was no open wound.  He is to use Tylenol for pain given his uncontrolled hypertension.  He was agreeable to stopping losartan and adding lisinopril hydrochlorothiazide.  His physical exam findings regarding his hypertension are reassuring today.  He is to follow-up with his PCP.  ER and return to clinic precautions reviewed.  Wallis Bamberg, PA-C Primary Care at [facility] [phone] 11/13/2017  11:31 AM

## 2018-02-16 DIAGNOSIS — E782 Mixed hyperlipidemia: Secondary | ICD-10-CM | POA: Diagnosis not present

## 2018-02-16 DIAGNOSIS — N529 Male erectile dysfunction, unspecified: Secondary | ICD-10-CM | POA: Diagnosis not present

## 2018-02-16 DIAGNOSIS — R7303 Prediabetes: Secondary | ICD-10-CM | POA: Diagnosis not present

## 2018-02-16 DIAGNOSIS — I10 Essential (primary) hypertension: Secondary | ICD-10-CM | POA: Diagnosis not present

## 2018-02-16 MED FILL — LOSARTAN POTASSIUM 100 MG T: 100 | 90 days supply | Qty: 90 | Fill #0

## 2018-02-16 MED FILL — AMLODIPINE BESYLATE 5 MG TA: 5 | 90 days supply | Qty: 90 | Fill #0

## 2018-05-18 MED FILL — FENOFIBRATE 145 MG TABLET: 145 | 90 days supply | Qty: 90 | Fill #0

## 2018-05-18 MED FILL — PRAVASTATIN NA 40 MG TAB: 40 | 90 days supply | Qty: 90 | Fill #0

## 2018-05-18 MED FILL — LOSARTAN POTASSIUM 100 MG T: 100 | 90 days supply | Qty: 90 | Fill #1

## 2018-05-18 MED FILL — AMLODIPINE BESYLATE 5 MG TA: 5 | 90 days supply | Qty: 90 | Fill #1

## 2018-07-06 DIAGNOSIS — S91331A Puncture wound without foreign body, right foot, initial encounter: Secondary | ICD-10-CM | POA: Diagnosis not present

## 2018-07-06 MED FILL — CIPROFLOXACIN HCL 500 MG TA: 500 | 14 days supply | Qty: 28 | Fill #0

## 2018-08-31 MED FILL — LOSARTAN POTASSIUM 100 MG T: 100 | 90 days supply | Qty: 90 | Fill #2

## 2018-08-31 MED FILL — AMLODIPINE BESYLATE 5 MG TA: 5 | 90 days supply | Qty: 90 | Fill #2

## 2018-08-31 MED FILL — OMEGA-3-ACID ETHYL ESTERS 1: 1 | 90 days supply | Qty: 360 | Fill #1

## 2018-12-04 MED FILL — AMLODIPINE BESYLATE 5 MG TA: 5 | 90 days supply | Qty: 90 | Fill #3

## 2018-12-04 MED FILL — LOSARTAN POTASSIUM 100 MG T: 100 | 90 days supply | Qty: 90 | Fill #3

## 2019-02-04 DIAGNOSIS — N529 Male erectile dysfunction, unspecified: Secondary | ICD-10-CM | POA: Diagnosis not present

## 2019-02-04 DIAGNOSIS — Z1211 Encounter for screening for malignant neoplasm of colon: Secondary | ICD-10-CM | POA: Diagnosis not present

## 2019-02-04 DIAGNOSIS — R7303 Prediabetes: Secondary | ICD-10-CM | POA: Diagnosis not present

## 2019-02-04 DIAGNOSIS — E782 Mixed hyperlipidemia: Secondary | ICD-10-CM | POA: Diagnosis not present

## 2019-02-04 DIAGNOSIS — I10 Essential (primary) hypertension: Secondary | ICD-10-CM | POA: Diagnosis not present

## 2019-02-04 DIAGNOSIS — Z125 Encounter for screening for malignant neoplasm of prostate: Secondary | ICD-10-CM | POA: Diagnosis not present

## 2019-02-04 MED FILL — PRAVASTATIN NA 40 MG TAB: 40 | 90 days supply | Qty: 90 | Fill #0

## 2019-02-04 MED FILL — FENOFIBRATE 145 MG TABS: 145 | 90 days supply | Qty: 90 | Fill #0

## 2019-02-04 MED FILL — LOSARTAN-HCTZ 100-25 MG TAB: 100-25 | 90 days supply | Qty: 90 | Fill #0

## 2019-02-04 MED FILL — OMEGA-3-ACID ETHYL ESTERS 1: 1 | 90 days supply | Qty: 360 | Fill #0

## 2019-02-04 MED FILL — SILDENAFIL CITRATE 100 MG T: 100 | 90 days supply | Qty: 18 | Fill #0

## 2019-05-03 MED FILL — LOSARTAN-HCTZ 100-25 MG TAB: 100-25 | 90 days supply | Qty: 90 | Fill #1

## 2019-05-22 ENCOUNTER — Ambulatory Visit: Payer: 59 | Attending: Internal Medicine

## 2019-05-22 DIAGNOSIS — Z23 Encounter for immunization: Secondary | ICD-10-CM

## 2019-05-22 NOTE — Progress Notes (Signed)
   Covid-19 Vaccination Clinic  Name:  CAIDENCE HIGASHI    MRN: [id number] DOB: 1960/11/27  05/22/2019  Mr. Stidd was observed post Covid-19 immunization for 15 minutes without incident. He was provided with Vaccine Information Sheet and instruction to access the V-Safe system.   Mr. Rossetti was instructed to call 911 with any severe reactions post vaccine: Marland Kitchen Difficulty breathing  . Swelling of face and throat  . A fast heartbeat  . A bad rash all over body  . Dizziness and weakness   Immunizations Administered    Name Date Dose VIS Date Route   Pfizer COVID-19 Vaccine 05/22/2019  8:36 AM 0.3 mL 01/29/2019 Intramuscular   Manufacturer: [employer], [employer]   Lot: [id number]   NDC: [address]-2

## 2019-06-16 ENCOUNTER — Ambulatory Visit: Payer: 59 | Attending: Internal Medicine

## 2019-06-16 DIAGNOSIS — Z23 Encounter for immunization: Secondary | ICD-10-CM

## 2019-06-16 NOTE — Progress Notes (Signed)
   Covid-19 Vaccination Clinic  Name:  Cody Lindsey    MRN: [id number] DOB: January 28, 1961  06/16/2019  [name] was observed post Covid-19 immunization for 15 minutes without incident. He was provided with Vaccine Information Sheet and instruction to access the V-Safe system.   [name] was instructed to call 911 with any severe reactions post vaccine: Marland Kitchen Difficulty breathing  . Swelling of face and throat  . A fast heartbeat  . A bad rash all over body  . Dizziness and weakness   Immunizations Administered    Name Date Dose VIS Date Route   Pfizer COVID-19 Vaccine 06/16/2019  8:30 AM 0.3 mL 04/14/2018 Intramuscular   Manufacturer: [employer], [employer]   Lot: [id number]   NDC: [address]-8

## 2019-09-16 MED FILL — LOSARTAN-HCTZ 100-25 MG TAB: 100-25 | 90 days supply | Qty: 90 | Fill #2

## 2019-11-29 MED FILL — OMEGA-3-ACID ETHYL ESTERS 1: 1 | 90 days supply | Qty: 360 | Fill #1

## 2019-11-29 MED FILL — FENOFIBRATE 145 MG TABS: 145 | 90 days supply | Qty: 90 | Fill #1

## 2019-11-29 MED FILL — SILDENAFIL CITRATE 100 MG T: 100 | 90 days supply | Qty: 18 | Fill #1

## 2019-11-29 MED FILL — PRAVASTATIN NA 40 MG TAB: 40 | 90 days supply | Qty: 90 | Fill #1

## 2019-12-16 MED FILL — LOSARTAN-HCTZ 100-25 MG TAB: 100-25 | 90 days supply | Qty: 90 | Fill #3

## 2020-01-24 DIAGNOSIS — Z1211 Encounter for screening for malignant neoplasm of colon: Secondary | ICD-10-CM | POA: Diagnosis not present

## 2020-01-24 DIAGNOSIS — R7303 Prediabetes: Secondary | ICD-10-CM | POA: Diagnosis not present

## 2020-01-24 DIAGNOSIS — Z125 Encounter for screening for malignant neoplasm of prostate: Secondary | ICD-10-CM | POA: Diagnosis not present

## 2020-01-24 DIAGNOSIS — N529 Male erectile dysfunction, unspecified: Secondary | ICD-10-CM | POA: Diagnosis not present

## 2020-01-24 DIAGNOSIS — E782 Mixed hyperlipidemia: Secondary | ICD-10-CM | POA: Diagnosis not present

## 2020-01-24 DIAGNOSIS — I10 Essential (primary) hypertension: Secondary | ICD-10-CM | POA: Diagnosis not present

## 2020-01-31 DIAGNOSIS — Z1211 Encounter for screening for malignant neoplasm of colon: Secondary | ICD-10-CM | POA: Diagnosis not present

## 2020-01-31 DIAGNOSIS — Z125 Encounter for screening for malignant neoplasm of prostate: Secondary | ICD-10-CM | POA: Diagnosis not present

## 2020-01-31 DIAGNOSIS — E6609 Other obesity due to excess calories: Secondary | ICD-10-CM | POA: Diagnosis not present

## 2020-01-31 DIAGNOSIS — E782 Mixed hyperlipidemia: Secondary | ICD-10-CM | POA: Diagnosis not present

## 2020-01-31 DIAGNOSIS — Z0000 Encounter for general adult medical examination without abnormal findings: Secondary | ICD-10-CM | POA: Diagnosis not present

## 2020-01-31 DIAGNOSIS — R7303 Prediabetes: Secondary | ICD-10-CM | POA: Diagnosis not present

## 2020-01-31 DIAGNOSIS — Z6830 Body mass index (BMI) 30.0-30.9, adult: Secondary | ICD-10-CM | POA: Diagnosis not present

## 2020-01-31 DIAGNOSIS — N529 Male erectile dysfunction, unspecified: Secondary | ICD-10-CM | POA: Diagnosis not present

## 2020-01-31 DIAGNOSIS — I10 Essential (primary) hypertension: Secondary | ICD-10-CM | POA: Diagnosis not present

## 2020-03-06 ENCOUNTER — Other Ambulatory Visit: Payer: Self-pay

## 2020-03-06 ENCOUNTER — Ambulatory Visit: Payer: 59 | Attending: Internal Medicine

## 2020-03-06 ENCOUNTER — Other Ambulatory Visit (HOSPITAL_BASED_OUTPATIENT_CLINIC_OR_DEPARTMENT_OTHER): Payer: Self-pay | Admitting: Internal Medicine

## 2020-03-06 DIAGNOSIS — Z23 Encounter for immunization: Secondary | ICD-10-CM

## 2020-03-06 NOTE — Progress Notes (Signed)
   Covid-19 Vaccination Clinic  Name:  KLYE BESECKER    MRN: [id number] DOB: 1960-07-03  03/06/2020  Mr. Lassen was observed post Covid-19 immunization for 15 minutes without incident. He was provided with Vaccine Information Sheet and instruction to access the V-Safe system.   Mr. Geter was instructed to call 911 with any severe reactions post vaccine: Marland Kitchen Difficulty breathing  . Swelling of face and throat  . A fast heartbeat  . A bad rash all over body  . Dizziness and weakness   Immunizations Administered    Name Date Dose VIS Date Route   Pfizer COVID-19 Vaccine 03/06/2020  2:27 PM 0.3 mL 12/08/2019 Intramuscular   Manufacturer: [employer], [employer]   Lot: [id number]   NDC: [address]-7

## 2020-03-07 MED FILL — PFIZER-BIONTECH COVID-19 VA: 30 | 21 days supply | Qty: 0 | Fill #0

## 2020-03-13 ENCOUNTER — Other Ambulatory Visit (HOSPITAL_BASED_OUTPATIENT_CLINIC_OR_DEPARTMENT_OTHER): Payer: Self-pay | Admitting: Family Medicine

## 2020-03-13 MED FILL — LOSARTAN-HCTZ 100-25 MG TAB: 100-25 | 90 days supply | Qty: 90 | Fill #0

## 2020-06-15 ENCOUNTER — Other Ambulatory Visit (HOSPITAL_BASED_OUTPATIENT_CLINIC_OR_DEPARTMENT_OTHER): Payer: Self-pay

## 2020-06-15 MED FILL — Losartan Potassium & Hydrochlorothiazide Tab 100-25 MG: ORAL | 90 days supply | Qty: 90 | Fill #0 | Status: AC

## 2020-06-23 ENCOUNTER — Other Ambulatory Visit (HOSPITAL_BASED_OUTPATIENT_CLINIC_OR_DEPARTMENT_OTHER): Payer: Self-pay

## 2020-06-28 ENCOUNTER — Other Ambulatory Visit (HOSPITAL_BASED_OUTPATIENT_CLINIC_OR_DEPARTMENT_OTHER): Payer: Self-pay

## 2020-06-28 MED ORDER — SILDENAFIL CITRATE 100 MG PO TABS
ORAL_TABLET | ORAL | 1 refills | Status: DC
  Filled 2020-06-28: qty 6, 30d supply, fill #0

## 2020-07-03 ENCOUNTER — Other Ambulatory Visit (HOSPITAL_BASED_OUTPATIENT_CLINIC_OR_DEPARTMENT_OTHER): Payer: Self-pay

## 2020-07-26 ENCOUNTER — Other Ambulatory Visit (HOSPITAL_BASED_OUTPATIENT_CLINIC_OR_DEPARTMENT_OTHER): Payer: Self-pay

## 2020-08-07 DIAGNOSIS — E6609 Other obesity due to excess calories: Secondary | ICD-10-CM | POA: Diagnosis not present

## 2020-08-07 DIAGNOSIS — N529 Male erectile dysfunction, unspecified: Secondary | ICD-10-CM | POA: Diagnosis not present

## 2020-08-07 DIAGNOSIS — R7303 Prediabetes: Secondary | ICD-10-CM | POA: Diagnosis not present

## 2020-08-07 DIAGNOSIS — Z6830 Body mass index (BMI) 30.0-30.9, adult: Secondary | ICD-10-CM | POA: Diagnosis not present

## 2020-08-07 DIAGNOSIS — E782 Mixed hyperlipidemia: Secondary | ICD-10-CM | POA: Diagnosis not present

## 2020-08-07 DIAGNOSIS — I10 Essential (primary) hypertension: Secondary | ICD-10-CM | POA: Diagnosis not present

## 2020-08-11 ENCOUNTER — Other Ambulatory Visit (HOSPITAL_BASED_OUTPATIENT_CLINIC_OR_DEPARTMENT_OTHER): Payer: Self-pay

## 2020-08-11 MED ORDER — SILDENAFIL CITRATE 100 MG PO TABS
ORAL_TABLET | ORAL | 3 refills | Status: DC
  Filled 2020-08-11: qty 6, 30d supply, fill #0

## 2020-08-11 MED ORDER — FENOFIBRATE 145 MG PO TABS
ORAL_TABLET | ORAL | 2 refills | Status: DC
  Filled 2020-08-11: qty 90, 90d supply, fill #0

## 2020-08-11 MED ORDER — PRAVASTATIN SODIUM 40 MG PO TABS
ORAL_TABLET | ORAL | 2 refills | Status: DC
  Filled 2020-08-11: qty 90, 90d supply, fill #0

## 2020-09-11 ENCOUNTER — Other Ambulatory Visit (HOSPITAL_BASED_OUTPATIENT_CLINIC_OR_DEPARTMENT_OTHER): Payer: Self-pay

## 2020-09-11 MED ORDER — LOSARTAN POTASSIUM-HCTZ 100-25 MG PO TABS
ORAL_TABLET | ORAL | 2 refills | Status: DC
  Filled 2020-09-11: qty 90, 90d supply, fill #0
  Filled 2020-12-14: qty 90, 90d supply, fill #1
  Filled 2021-03-14: qty 90, 90d supply, fill #2

## 2020-09-13 ENCOUNTER — Other Ambulatory Visit (HOSPITAL_BASED_OUTPATIENT_CLINIC_OR_DEPARTMENT_OTHER): Payer: Self-pay

## 2020-11-13 ENCOUNTER — Other Ambulatory Visit (HOSPITAL_BASED_OUTPATIENT_CLINIC_OR_DEPARTMENT_OTHER): Payer: Self-pay

## 2020-11-13 MED ORDER — OMEGA-3-ACID ETHYL ESTERS 1 G PO CAPS
ORAL_CAPSULE | ORAL | 3 refills | Status: AC
  Filled 2020-11-13: qty 360, 90d supply, fill #0
  Filled 2021-07-11: qty 360, 90d supply, fill #1

## 2020-11-13 MED ORDER — SILDENAFIL CITRATE 100 MG PO TABS
ORAL_TABLET | ORAL | 3 refills | Status: DC
  Filled 2020-11-13: qty 18, 90d supply, fill #0

## 2020-12-14 ENCOUNTER — Other Ambulatory Visit (HOSPITAL_BASED_OUTPATIENT_CLINIC_OR_DEPARTMENT_OTHER): Payer: Self-pay

## 2021-03-14 ENCOUNTER — Other Ambulatory Visit (HOSPITAL_BASED_OUTPATIENT_CLINIC_OR_DEPARTMENT_OTHER): Payer: Self-pay

## 2021-03-26 DIAGNOSIS — Z0000 Encounter for general adult medical examination without abnormal findings: Secondary | ICD-10-CM | POA: Diagnosis not present

## 2021-03-26 DIAGNOSIS — Z125 Encounter for screening for malignant neoplasm of prostate: Secondary | ICD-10-CM | POA: Diagnosis not present

## 2021-03-26 DIAGNOSIS — E782 Mixed hyperlipidemia: Secondary | ICD-10-CM | POA: Diagnosis not present

## 2021-03-26 DIAGNOSIS — Z23 Encounter for immunization: Secondary | ICD-10-CM | POA: Diagnosis not present

## 2021-03-26 DIAGNOSIS — N529 Male erectile dysfunction, unspecified: Secondary | ICD-10-CM | POA: Diagnosis not present

## 2021-03-26 DIAGNOSIS — R7303 Prediabetes: Secondary | ICD-10-CM | POA: Diagnosis not present

## 2021-03-26 DIAGNOSIS — Z6830 Body mass index (BMI) 30.0-30.9, adult: Secondary | ICD-10-CM | POA: Diagnosis not present

## 2021-03-26 DIAGNOSIS — E6609 Other obesity due to excess calories: Secondary | ICD-10-CM | POA: Diagnosis not present

## 2021-03-26 DIAGNOSIS — I10 Essential (primary) hypertension: Secondary | ICD-10-CM | POA: Diagnosis not present

## 2021-03-26 DIAGNOSIS — Z1211 Encounter for screening for malignant neoplasm of colon: Secondary | ICD-10-CM | POA: Diagnosis not present

## 2021-06-12 ENCOUNTER — Other Ambulatory Visit (HOSPITAL_BASED_OUTPATIENT_CLINIC_OR_DEPARTMENT_OTHER): Payer: Self-pay

## 2021-06-12 MED ORDER — FENOFIBRATE 145 MG PO TABS
ORAL_TABLET | ORAL | 2 refills | Status: DC
  Filled 2021-06-12: qty 90, 90d supply, fill #0

## 2021-06-13 ENCOUNTER — Other Ambulatory Visit (HOSPITAL_BASED_OUTPATIENT_CLINIC_OR_DEPARTMENT_OTHER): Payer: Self-pay

## 2021-06-13 MED ORDER — PRAVASTATIN SODIUM 40 MG PO TABS
40.0000 mg | ORAL_TABLET | Freq: Every day | ORAL | 2 refills | Status: DC
  Filled 2021-06-13: qty 90, 90d supply, fill #0
  Filled 2021-09-13: qty 90, 90d supply, fill #1

## 2021-06-13 MED ORDER — SILDENAFIL CITRATE 100 MG PO TABS
100.0000 mg | ORAL_TABLET | Freq: Every day | ORAL | 2 refills | Status: DC | PRN
  Filled 2021-06-13: qty 18, 90d supply, fill #0

## 2021-06-13 MED ORDER — LOSARTAN POTASSIUM-HCTZ 100-25 MG PO TABS
1.0000 | ORAL_TABLET | Freq: Every day | ORAL | 2 refills | Status: DC
  Filled 2021-06-13: qty 90, 90d supply, fill #0
  Filled 2021-09-13: qty 90, 90d supply, fill #1

## 2021-06-14 ENCOUNTER — Other Ambulatory Visit (HOSPITAL_BASED_OUTPATIENT_CLINIC_OR_DEPARTMENT_OTHER): Payer: Self-pay

## 2021-06-14 MED ORDER — OMEGA-3-ACID ETHYL ESTERS 1 G PO CAPS
ORAL_CAPSULE | ORAL | 2 refills | Status: DC
  Filled 2021-06-14 – 2021-06-18 (×2): qty 360, 90d supply, fill #0

## 2021-06-18 ENCOUNTER — Other Ambulatory Visit (HOSPITAL_BASED_OUTPATIENT_CLINIC_OR_DEPARTMENT_OTHER): Payer: Self-pay

## 2021-06-29 ENCOUNTER — Other Ambulatory Visit (HOSPITAL_BASED_OUTPATIENT_CLINIC_OR_DEPARTMENT_OTHER): Payer: Self-pay

## 2021-07-11 ENCOUNTER — Other Ambulatory Visit (HOSPITAL_BASED_OUTPATIENT_CLINIC_OR_DEPARTMENT_OTHER): Payer: Self-pay

## 2021-09-13 ENCOUNTER — Other Ambulatory Visit (HOSPITAL_BASED_OUTPATIENT_CLINIC_OR_DEPARTMENT_OTHER): Payer: Self-pay

## 2021-10-01 DIAGNOSIS — I7090 Unspecified atherosclerosis: Secondary | ICD-10-CM | POA: Diagnosis not present

## 2021-10-01 DIAGNOSIS — E782 Mixed hyperlipidemia: Secondary | ICD-10-CM | POA: Diagnosis not present

## 2021-10-01 DIAGNOSIS — I10 Essential (primary) hypertension: Secondary | ICD-10-CM | POA: Diagnosis not present

## 2021-10-01 DIAGNOSIS — Z23 Encounter for immunization: Secondary | ICD-10-CM | POA: Diagnosis not present

## 2021-10-01 DIAGNOSIS — R7303 Prediabetes: Secondary | ICD-10-CM | POA: Diagnosis not present

## 2021-11-27 ENCOUNTER — Other Ambulatory Visit (HOSPITAL_BASED_OUTPATIENT_CLINIC_OR_DEPARTMENT_OTHER): Payer: Self-pay

## 2021-11-27 MED ORDER — SILDENAFIL CITRATE 100 MG PO TABS
ORAL_TABLET | ORAL | 2 refills | Status: DC
  Filled 2021-11-27: qty 7, 35d supply, fill #0
  Filled 2021-11-27: qty 11, 55d supply, fill #0

## 2021-12-17 ENCOUNTER — Other Ambulatory Visit (HOSPITAL_BASED_OUTPATIENT_CLINIC_OR_DEPARTMENT_OTHER): Payer: Self-pay

## 2021-12-17 MED ORDER — LOSARTAN POTASSIUM-HCTZ 100-25 MG PO TABS
1.0000 | ORAL_TABLET | Freq: Every day | ORAL | 1 refills | Status: DC
  Filled 2021-12-17 (×2): qty 90, 90d supply, fill #0
  Filled 2021-12-17: qty 3, 3d supply, fill #0

## 2022-03-18 ENCOUNTER — Other Ambulatory Visit (HOSPITAL_BASED_OUTPATIENT_CLINIC_OR_DEPARTMENT_OTHER): Payer: Self-pay

## 2022-03-18 MED ORDER — PRAVASTATIN SODIUM 40 MG PO TABS
40.0000 mg | ORAL_TABLET | Freq: Every day | ORAL | 1 refills | Status: DC
  Filled 2022-03-18: qty 90, 90d supply, fill #0

## 2022-03-18 MED ORDER — FENOFIBRATE 145 MG PO TABS
145.0000 mg | ORAL_TABLET | Freq: Every day | ORAL | 1 refills | Status: DC
  Filled 2022-03-18: qty 90, 90d supply, fill #0

## 2022-03-18 MED ORDER — SILDENAFIL CITRATE 100 MG PO TABS
100.0000 mg | ORAL_TABLET | Freq: Every day | ORAL | 0 refills | Status: DC | PRN
  Filled 2022-03-18: qty 30, 30d supply, fill #0

## 2022-03-18 MED ORDER — LOSARTAN POTASSIUM-HCTZ 100-25 MG PO TABS
1.0000 | ORAL_TABLET | Freq: Every day | ORAL | 1 refills | Status: DC
  Filled 2022-03-18: qty 90, 90d supply, fill #0
  Filled 2022-06-14: qty 90, 90d supply, fill #1

## 2022-06-14 ENCOUNTER — Other Ambulatory Visit (HOSPITAL_BASED_OUTPATIENT_CLINIC_OR_DEPARTMENT_OTHER): Payer: Self-pay

## 2022-08-19 ENCOUNTER — Other Ambulatory Visit (HOSPITAL_BASED_OUTPATIENT_CLINIC_OR_DEPARTMENT_OTHER): Payer: Self-pay

## 2022-08-19 DIAGNOSIS — E6609 Other obesity due to excess calories: Secondary | ICD-10-CM | POA: Diagnosis not present

## 2022-08-19 DIAGNOSIS — Z0000 Encounter for general adult medical examination without abnormal findings: Secondary | ICD-10-CM | POA: Diagnosis not present

## 2022-08-19 DIAGNOSIS — Z6831 Body mass index (BMI) 31.0-31.9, adult: Secondary | ICD-10-CM | POA: Diagnosis not present

## 2022-08-19 DIAGNOSIS — I10 Essential (primary) hypertension: Secondary | ICD-10-CM | POA: Diagnosis not present

## 2022-08-19 DIAGNOSIS — Z131 Encounter for screening for diabetes mellitus: Secondary | ICD-10-CM | POA: Diagnosis not present

## 2022-08-19 DIAGNOSIS — R7303 Prediabetes: Secondary | ICD-10-CM | POA: Diagnosis not present

## 2022-08-19 DIAGNOSIS — E782 Mixed hyperlipidemia: Secondary | ICD-10-CM | POA: Diagnosis not present

## 2022-08-19 DIAGNOSIS — N529 Male erectile dysfunction, unspecified: Secondary | ICD-10-CM | POA: Diagnosis not present

## 2022-08-19 DIAGNOSIS — Z125 Encounter for screening for malignant neoplasm of prostate: Secondary | ICD-10-CM | POA: Diagnosis not present

## 2022-08-19 DIAGNOSIS — Z1211 Encounter for screening for malignant neoplasm of colon: Secondary | ICD-10-CM | POA: Diagnosis not present

## 2022-08-19 MED ORDER — SILDENAFIL CITRATE 100 MG PO TABS
100.0000 mg | ORAL_TABLET | Freq: Every day | ORAL | 1 refills | Status: DC | PRN
  Filled 2022-08-19: qty 30, 30d supply, fill #0
  Filled 2023-07-17: qty 30, 30d supply, fill #1

## 2022-08-19 MED ORDER — OLMESARTAN MEDOXOMIL-HCTZ 40-25 MG PO TABS
1.0000 | ORAL_TABLET | Freq: Every day | ORAL | 0 refills | Status: DC
  Filled 2022-08-19: qty 90, 90d supply, fill #0

## 2022-08-19 MED ORDER — PRAVASTATIN SODIUM 80 MG PO TABS
80.0000 mg | ORAL_TABLET | Freq: Every day | ORAL | 3 refills | Status: DC
  Filled 2022-08-19: qty 90, 90d supply, fill #0

## 2022-08-19 MED ORDER — FENOFIBRATE 145 MG PO TABS
145.0000 mg | ORAL_TABLET | Freq: Every day | ORAL | 1 refills | Status: DC
  Filled 2022-08-19: qty 90, 90d supply, fill #0

## 2022-08-19 MED ORDER — PRAVASTATIN SODIUM 40 MG PO TABS
40.0000 mg | ORAL_TABLET | Freq: Every day | ORAL | 3 refills | Status: DC
  Filled 2022-08-19: qty 90, 90d supply, fill #0

## 2022-08-19 MED ORDER — OMEGA-3-ACID ETHYL ESTERS 1 G PO CAPS
2.0000 g | ORAL_CAPSULE | Freq: Two times a day (BID) | ORAL | 2 refills | Status: DC
  Filled 2022-08-19 – 2023-06-03 (×2): qty 360, 90d supply, fill #0

## 2022-08-20 DIAGNOSIS — Z1211 Encounter for screening for malignant neoplasm of colon: Secondary | ICD-10-CM | POA: Diagnosis not present

## 2022-09-30 ENCOUNTER — Other Ambulatory Visit (HOSPITAL_BASED_OUTPATIENT_CLINIC_OR_DEPARTMENT_OTHER): Payer: Self-pay

## 2022-09-30 DIAGNOSIS — I10 Essential (primary) hypertension: Secondary | ICD-10-CM | POA: Diagnosis not present

## 2022-09-30 DIAGNOSIS — E782 Mixed hyperlipidemia: Secondary | ICD-10-CM | POA: Diagnosis not present

## 2022-09-30 DIAGNOSIS — Z6831 Body mass index (BMI) 31.0-31.9, adult: Secondary | ICD-10-CM | POA: Diagnosis not present

## 2022-09-30 MED ORDER — OLMESARTAN MEDOXOMIL-HCTZ 40-25 MG PO TABS
1.0000 | ORAL_TABLET | Freq: Every day | ORAL | 1 refills | Status: DC
  Filled 2022-09-30 – 2022-11-12 (×2): qty 90, 90d supply, fill #0
  Filled 2023-02-17: qty 90, 90d supply, fill #1

## 2022-09-30 MED ORDER — OMEGA-3-ACID ETHYL ESTERS 1 G PO CAPS
2.0000 g | ORAL_CAPSULE | Freq: Two times a day (BID) | ORAL | 3 refills | Status: DC
  Filled 2022-09-30 (×2): qty 360, 90d supply, fill #0

## 2022-11-12 ENCOUNTER — Other Ambulatory Visit (HOSPITAL_BASED_OUTPATIENT_CLINIC_OR_DEPARTMENT_OTHER): Payer: Self-pay

## 2023-04-01 ENCOUNTER — Other Ambulatory Visit (HOSPITAL_BASED_OUTPATIENT_CLINIC_OR_DEPARTMENT_OTHER): Payer: Self-pay

## 2023-04-01 MED ORDER — AMOXICILLIN 500 MG PO CAPS
500.0000 mg | ORAL_CAPSULE | Freq: Four times a day (QID) | ORAL | 0 refills | Status: DC
  Filled 2023-04-01: qty 28, 7d supply, fill #0

## 2023-04-03 ENCOUNTER — Other Ambulatory Visit (HOSPITAL_BASED_OUTPATIENT_CLINIC_OR_DEPARTMENT_OTHER): Payer: Self-pay

## 2023-04-03 MED ORDER — DOXYCYCLINE HYCLATE 100 MG PO CAPS
100.0000 mg | ORAL_CAPSULE | Freq: Every day | ORAL | 1 refills | Status: DC
  Filled 2023-04-03: qty 30, 30d supply, fill #0
  Filled 2023-05-07: qty 30, 30d supply, fill #1

## 2023-05-02 ENCOUNTER — Encounter: Payer: Self-pay | Admitting: Family Medicine

## 2023-05-05 ENCOUNTER — Ambulatory Visit: Payer: Commercial Managed Care - PPO | Admitting: Family Medicine

## 2023-05-05 ENCOUNTER — Encounter: Payer: Self-pay | Admitting: Family Medicine

## 2023-05-05 ENCOUNTER — Other Ambulatory Visit (HOSPITAL_BASED_OUTPATIENT_CLINIC_OR_DEPARTMENT_OTHER): Payer: Self-pay

## 2023-05-05 VITALS — BP 124/76 | HR 52 | Temp 98.1°F | Ht 64.75 in | Wt 185.0 lb

## 2023-05-05 DIAGNOSIS — I10 Essential (primary) hypertension: Secondary | ICD-10-CM | POA: Diagnosis not present

## 2023-05-05 DIAGNOSIS — J301 Allergic rhinitis due to pollen: Secondary | ICD-10-CM | POA: Diagnosis not present

## 2023-05-05 DIAGNOSIS — E782 Mixed hyperlipidemia: Secondary | ICD-10-CM | POA: Diagnosis not present

## 2023-05-05 DIAGNOSIS — Z1159 Encounter for screening for other viral diseases: Secondary | ICD-10-CM | POA: Diagnosis not present

## 2023-05-05 DIAGNOSIS — Z23 Encounter for immunization: Secondary | ICD-10-CM

## 2023-05-05 DIAGNOSIS — Z1211 Encounter for screening for malignant neoplasm of colon: Secondary | ICD-10-CM | POA: Diagnosis not present

## 2023-05-05 LAB — LIPID PANEL
Cholesterol: 218 mg/dL — ABNORMAL HIGH (ref 0–200)
HDL: 43.2 mg/dL (ref >39.00)
LDL Cholesterol: 131 mg/dL — ABNORMAL HIGH (ref 0–99)
NonHDL: 174.65
Total CHOL/HDL Ratio: 5
Triglycerides: 216 mg/dL — ABNORMAL HIGH (ref 0.0–149.0)
VLDL: 43.2 mg/dL — ABNORMAL HIGH (ref 0.0–40.0)

## 2023-05-05 LAB — BASIC METABOLIC PANEL
BUN: 12 mg/dL (ref 6–23)
CO2: 30 meq/L (ref 19–32)
Calcium: 9.6 mg/dL (ref 8.4–10.5)
Chloride: 100 meq/L (ref 96–112)
Creatinine, Ser: 0.9 mg/dL (ref 0.40–1.50)
GFR: 91.61 mL/min (ref >60.00)
Glucose, Bld: 105 mg/dL — ABNORMAL HIGH (ref 70–99)
Potassium: 3.7 meq/L (ref 3.5–5.1)
Sodium: 138 meq/L (ref 135–145)

## 2023-05-05 MED ORDER — ATORVASTATIN CALCIUM 20 MG PO TABS
20.0000 mg | ORAL_TABLET | Freq: Every day | ORAL | 3 refills | Status: AC
  Filled 2023-05-05: qty 90, 90d supply, fill #0
  Filled 2023-08-05: qty 90, 90d supply, fill #1
  Filled 2023-11-04 – 2023-12-23 (×2): qty 90, 90d supply, fill #2
  Filled 2024-03-20: qty 90, 90d supply, fill #3

## 2023-05-05 MED ORDER — OLMESARTAN MEDOXOMIL-HCTZ 40-25 MG PO TABS
1.0000 | ORAL_TABLET | Freq: Every day | ORAL | 3 refills | Status: DC
  Filled 2023-05-05: qty 90, 90d supply, fill #0

## 2023-05-05 MED ORDER — AMLODIPINE BESYLATE 10 MG PO TABS
10.0000 mg | ORAL_TABLET | Freq: Every day | ORAL | 3 refills | Status: AC
  Filled 2023-05-05: qty 90, 90d supply, fill #0
  Filled 2023-12-23: qty 90, 90d supply, fill #1
  Filled 2024-03-20: qty 90, 90d supply, fill #2

## 2023-05-05 NOTE — Assessment & Plan Note (Signed)
 Continue atorvastatin 10 mg (Caduet) and fenofibrate 145 mg daily. I will check lipids today.

## 2023-05-05 NOTE — Assessment & Plan Note (Signed)
 Recommend he use a daily antihistamine. I recommend he avoid the combination with a decongestant if he is not having congestion issues.

## 2023-05-05 NOTE — Addendum Note (Signed)
 Addended by: Loyola Mast on: 05/05/2023 01:53 PM   Modules accepted: Orders

## 2023-05-05 NOTE — Progress Notes (Addendum)
 [facility] [address] [place] [address] Dept: [phone] Dept Fax: [phone]  New Patient Office Visit  Subjective:    Patient ID: Cody Lindsey, male    DOB: Aug 15, 1960, 63 y.o..   MRN: [id number]  Chief Complaint  Patient presents with   Establish Care    NP- establish care.  Fasting today.      History of Present Illness:  Patient is in today to establish care. Mr. Mines was born [place] He immigrated to the [place] in 2004. He took classes at [employer] in Haematologist and Lobbyist work. 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 Mr. Zweber has been married for 27 years. He ahs two children: a daughter (27) in [place] and a son (20) in college. He denies use of tobacco, alcohol, or drugs.  Mr. Spivack has a history of hypertension. He is managed on amlodipine (Caduet- combinaiton with atorvastatin) 10-10 mg daily and olmesartan-HCTZ (Benicar HCT) 40-25 mg daily.  Mr. Samson has a history of hyperlipidemia. He is managed on atorvastatin (Caduet- combinaiton with amlodipine) 10-10 mg daily, and fenofibrate 145 mg daily. His wife has noted him making a wheezing sound when sleeping more recently.  Mr. Laymon has a history of seasonal allergies. He typically uses Allegra-D for this when needed.  Mr. Bok has a history of ED. He manages this with sildenafil as needed.  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 No pertinent surgical history. Family History  Problem Relation Age of Onset   Hyperlipidemia Father    Hypertension Father    Outpatient Medications Prior to  Visit  Medication Sig Dispense Refill   amlodipine-atorvastatin (CADUET) 10-10 MG tablet Take 1 tablet by mouth daily.     aspirin 81 MG chewable tablet Chew by mouth daily.     cetirizine (ZYRTEC) 10 MG tablet Take 1 tablet by mouth daily.     cholecalciferol (VITAMIN D3) 25 MCG (1000 UNIT) tablet Take 1,000 Units by mouth daily.     cyanocobalamin (VITAMIN B12) 1000 MCG tablet Take 1,000 mcg by mouth daily.     doxycycline (VIBRAMYCIN) 100 MG capsule Take 1 capsule (100 mg total) by mouth daily with meal. 30 capsule 1   fenofibrate (TRICOR) 145 MG tablet Take 1 tablet (145 mg total) by mouth daily with food. 90 tablet 1   omega-3 acid ethyl esters (LOVAZA) 1 g capsule Take 2 capsules by mouth twice a day 360 capsule 3   omega-3 acid ethyl esters (LOVAZA) 1 g capsule Take 2 capsules (2 grams total) by mouth 2 (two) times daily. 360 capsule 2   sildenafil (VIAGRA) 100 MG tablet Take 1 tablet (100 mg total) by mouth as needed and as directed prior to coitus. 30 tablet 1   Turmeric (QC TUMERIC COMPLEX) 500 MG CAPS Take by mouth.     zinc gluconate 50 MG tablet Take 50 mg by mouth daily.     amoxicillin (AMOXIL) 500 MG capsule Take 1 capsule (500 mg total) by mouth 4 (four) times daily until gone. 28 capsule 0   losartan-hydrochlorothiazide (HYZAAR) 100-25 MG tablet Take 1 tablet by mouth once a day 90 tablet 2   sildenafil (  VIAGRA) 100 MG tablet Take 1 tablet (100 mg total) by mouth daily as needed as directed prior to coitus. 30 tablet 2   chlorpheniramine-HYDROcodone (TUSSIONEX PENNKINETIC ER) 10-8 MG/5ML SUER Take 5 mLs by mouth every 12 (twelve) hours as needed for cough. 100 mL 0   fenofibrate (TRICOR) 145 MG tablet Take 1 tablet by mouth with food once daily 90 tablet 2   fenofibrate (TRICOR) 145 MG tablet Take 1 tablet by mouth once daily with food 90 tablet 2   fenofibrate (TRICOR) 145 MG tablet Take 1 tablet (145 mg total) by mouth daily with food. 90 tablet 1   fenofibrate 54 MG tablet  Take 54 mg by mouth daily.     fexofenadine-pseudoephedrine (ALLEGRA-D) 60-120 MG 12 hr tablet Take 1 tablet by mouth 2 (two) times daily.     lisinopril-hydrochlorothiazide (PRINZIDE,ZESTORETIC) 20-25 MG tablet Take 1 tablet by mouth daily. 90 tablet 3   losartan-hydrochlorothiazide (HYZAAR) 100-25 MG tablet TAKE 1 TABLET BY MOUTH ONCE DAILY 90 tablet 1   losartan-hydrochlorothiazide (HYZAAR) 100-25 MG tablet Take 1 tablet by mouth daily. 90 tablet 2   losartan-hydrochlorothiazide (HYZAAR) 100-25 MG tablet Take 1 tablet by mouth daily. 90 tablet 1   losartan-hydrochlorothiazide (HYZAAR) 100-25 MG tablet Take 1 tablet by mouth daily. 90 tablet 1   olmesartan-hydrochlorothiazide (BENICAR HCT) 40-25 MG tablet Take 1 tablet by mouth daily. 90 tablet 1   omega-3 acid ethyl esters (LOVAZA) 1 g capsule Take by mouth 2 (two) times daily.     omega-3 acid ethyl esters (LOVAZA) 1 g capsule Take 2 capsules by mouth 2 times daily 360 capsule 2   omega-3 acid ethyl esters (LOVAZA) 1 g capsule Take 2 capsules (2 g total) by mouth 2 (two) times daily. 360 capsule 3   pravastatin (PRAVACHOL) 10 MG tablet Take 10 mg by mouth daily.     pravastatin (PRAVACHOL) 40 MG tablet Take 1 tablet by mouth once daily 90 tablet 2   pravastatin (PRAVACHOL) 40 MG tablet Take 1 tablet (40 mg total) by mouth daily. 90 tablet 2   pravastatin (PRAVACHOL) 40 MG tablet Take 1 tablet (40 mg total) by mouth daily. 90 tablet 1   pravastatin (PRAVACHOL) 80 MG tablet Take 1 tablet (80 mg total) by mouth at bedtime. 90 tablet 3   sildenafil (VIAGRA) 100 MG tablet Take one tablet by mouth as needed prior to coitus. 30 tablet 1   sildenafil (VIAGRA) 100 MG tablet Take 1 tablet by mouth as directed prior to coitus as needed 30 tablet 3   sildenafil (VIAGRA) 100 MG tablet Take 1 tablet by mouth as needed as directed prior to intercourse 30 tablet 3   sildenafil (VIAGRA) 100 MG tablet Take 1 tablet by mouth as  directed  prior  to coitus as  needed  (90 days) 30 tablet 2   sildenafil (VIAGRA) 100 MG tablet Take 1 tablet (100 mg total) by mouth as needed as directed prior to coitus. 30 tablet 0   No facility-administered medications prior to visit.   No Known Allergies Objective:   Today's Vitals   05/05/23 0852  BP: 124/76  Pulse: (!) 52  Temp: 98.1 F (36.7 C)  TempSrc: Temporal  SpO2: 97%  Weight: 185 lb (83.9 kg)  Height: 5' 4.75" (1.645 m)   Body mass index is 31.02 kg/m.   General: Well developed, well nourished. No acute distress. Lungs: Clear to auscultation bilaterally. No wheezing, rales or rhonchi. Psych: Alert and oriented. Normal mood  and affect.  Health Maintenance Due  Topic Date Due   HIV Screening  Never done   Hepatitis C Screening  Never done   Colonoscopy  Never done   INFLUENZA VACCINE  Never done     Assessment & Plan:   Problem List Items Addressed This Visit       Cardiovascular and Mediastinum   Essential hypertension - Primary   At goal. Continue amlodipine (Caduet) 10 mg daily and olmesartan-HCTZ (Benicar HCT) 40-25 mg daily. I will check annual renal labs today.      Relevant Medications   olmesartan-hydrochlorothiazide (BENICAR HCT) 40-25 MG tablet   Other Relevant Orders   Basic metabolic panel (Completed)     Respiratory   Allergic rhinitis   Recommend he use a daily antihistamine. I recommend he avoid the combination with a decongestant if he is not having congestion issues.        Other   Mixed hyperlipidemia   Continue atorvastatin 10 mg (Caduet) and fenofibrate 145 mg daily. I will check lipids today.      Relevant Medications   olmesartan-hydrochlorothiazide (BENICAR HCT) 40-25 MG tablet   Other Relevant Orders   Lipid panel (Completed)   Other Visit Diagnoses       Encounter for hepatitis C screening test for low risk patient       Relevant Orders   HCV Ab w Reflex to Quant PCR     Need for immunization against influenza       Relevant Orders   Flu  vaccine trivalent PF, 6mos and older(Flulaval,Afluria,Fluarix,Fluzone) (Completed)     Screening for colon cancer       Relevant Orders   Ambulatory referral to Gastroenterology       Return in about 4 months (around 09/04/2023) for Reassessment.   Loyola Mast, MD

## 2023-05-05 NOTE — Assessment & Plan Note (Signed)
 At goal. Continue amlodipine (Caduet) 10 mg daily and olmesartan-HCTZ (Benicar HCT) 40-25 mg daily. I will check annual renal labs today.

## 2023-05-05 NOTE — Addendum Note (Signed)
 Addended by: Loyola Mast on: 05/05/2023 04:08 PM   Modules accepted: Orders

## 2023-05-06 DIAGNOSIS — H5213 Myopia, bilateral: Secondary | ICD-10-CM | POA: Diagnosis not present

## 2023-05-07 ENCOUNTER — Other Ambulatory Visit (HOSPITAL_BASED_OUTPATIENT_CLINIC_OR_DEPARTMENT_OTHER): Payer: Self-pay

## 2023-05-07 LAB — HCV INTERPRETATION

## 2023-05-07 LAB — HCV AB W REFLEX TO QUANT PCR: HCV Ab: NONREACTIVE

## 2023-06-03 ENCOUNTER — Other Ambulatory Visit (HOSPITAL_BASED_OUTPATIENT_CLINIC_OR_DEPARTMENT_OTHER): Payer: Self-pay

## 2023-06-03 ENCOUNTER — Other Ambulatory Visit: Payer: Self-pay | Admitting: Family Medicine

## 2023-06-03 MED ORDER — CETIRIZINE HCL 10 MG PO TABS
10.0000 mg | ORAL_TABLET | Freq: Every day | ORAL | 3 refills | Status: DC
  Filled 2023-06-03 – 2023-07-17 (×2): qty 90, 90d supply, fill #0

## 2023-06-03 NOTE — Telephone Encounter (Signed)
 Requesting: cetirizine (ZYRTEC) 10 MG tablet  Last Visit: 05/05/2023 Next Visit: 09/08/2023 Last Refill: 03/26/2016 by Historical Provider  Please Advise

## 2023-06-04 ENCOUNTER — Other Ambulatory Visit: Payer: Self-pay | Admitting: Family Medicine

## 2023-06-04 ENCOUNTER — Other Ambulatory Visit (HOSPITAL_BASED_OUTPATIENT_CLINIC_OR_DEPARTMENT_OTHER): Payer: Self-pay

## 2023-06-04 ENCOUNTER — Other Ambulatory Visit: Payer: Self-pay

## 2023-06-04 MED ORDER — OMEGA-3-ACID ETHYL ESTERS 1 G PO CAPS
2.0000 g | ORAL_CAPSULE | Freq: Two times a day (BID) | ORAL | 2 refills | Status: DC
  Filled 2023-06-04 – 2023-07-17 (×2): qty 360, 90d supply, fill #0

## 2023-06-04 MED ORDER — OMEGA-3-ACID ETHYL ESTERS 1 G PO CAPS
2.0000 g | ORAL_CAPSULE | Freq: Two times a day (BID) | ORAL | 0 refills | Status: DC
Start: 2023-06-04 — End: 2023-09-15
  Filled 2023-07-18: qty 360, 90d supply, fill #0

## 2023-06-04 NOTE — Telephone Encounter (Signed)
 Copied from CRM [id number]. Topic: Clinical - Medication Refill >> Jun 04, 2023  8:13 AM Marlan Silva wrote: Most Recent Primary Care Visit:  Provider: Graig Lawyer  Department: [facility]  Visit Type: NEW PT - OFFICE VISIT  Date: 05/05/2023  Medication: omega-3 acid ethyl esters (LOVAZA) 1 g capsule  Has the patient contacted their pharmacy? Yes (Agent: If no, request that the patient contact the pharmacy for the refill. If patient does not wish to contact the pharmacy document the reason why and proceed with request.) (Agent: If yes, when and what did the pharmacy advise?)  Is this the correct pharmacy for this prescription? Yes If no, delete pharmacy and type the correct one.  This is the patient's preferred pharmacy:  [facility] [place] [facility] [address] [place] [address] Phone: [phone] Fax: [phone]   Has the prescription been filled recently? Yes  Is the patient out of the medication? Yes  Has the patient been seen for an appointment in the last year OR does the patient have an upcoming appointment? Yes  Can we respond through MyChart? Yes  Agent: Please be advised that Rx refills may take up to 3 business days. We ask that you follow-up with your pharmacy.

## 2023-06-05 ENCOUNTER — Other Ambulatory Visit (HOSPITAL_BASED_OUTPATIENT_CLINIC_OR_DEPARTMENT_OTHER): Payer: Self-pay

## 2023-06-16 ENCOUNTER — Other Ambulatory Visit (HOSPITAL_BASED_OUTPATIENT_CLINIC_OR_DEPARTMENT_OTHER): Payer: Self-pay

## 2023-07-17 ENCOUNTER — Other Ambulatory Visit: Payer: Self-pay | Admitting: Family Medicine

## 2023-07-17 ENCOUNTER — Other Ambulatory Visit (HOSPITAL_BASED_OUTPATIENT_CLINIC_OR_DEPARTMENT_OTHER): Payer: Self-pay

## 2023-07-17 NOTE — Telephone Encounter (Unsigned)
 Copied from CRM [id number]. Topic: Clinical - Medication Refill >> Jul 17, 2023 12:20 PM Alyse July wrote: Medication: omega-3 acid ethyl esters (LOVAZA ) 1 g capsule cetirizine  (ZYRTEC ) 10 MG tablet sildenafil  (VIAGRA ) 100 MG tablet  Has the patient contacted their pharmacy? Yes (Agent: If no, request that the patient contact the pharmacy for the refill. If patient does not wish to contact the pharmacy document the reason why and proceed with request.) (Agent: If yes, when and what did the pharmacy advise?)  This is the patient's preferred pharmacy:  [facility] [place] [facility] [address], [address] [place] [address] Phone: [phone] Fax: [phone]  Is this the correct pharmacy for this prescription? Yes If no, delete pharmacy and type the correct one.   Has the prescription been filled recently? No  Is the patient out of the medication? Yes  Has the patient been seen for an appointment in the last year OR does the patient have an upcoming appointment? Yes  Can we respond through MyChart? Yes  Agent: Please be advised that Rx refills may take up to 3 business days. We ask that you follow-up with your pharmacy.

## 2023-07-18 ENCOUNTER — Other Ambulatory Visit (HOSPITAL_BASED_OUTPATIENT_CLINIC_OR_DEPARTMENT_OTHER): Payer: Self-pay

## 2023-07-18 ENCOUNTER — Other Ambulatory Visit: Payer: Self-pay

## 2023-07-18 MED ORDER — SILDENAFIL CITRATE 100 MG PO TABS
100.0000 mg | ORAL_TABLET | Freq: Every day | ORAL | 11 refills | Status: AC | PRN
  Filled 2023-07-18: qty 6, 30d supply, fill #0
  Filled 2023-12-19: qty 6, 30d supply, fill #1
  Filled 2024-03-20: qty 6, 30d supply, fill #2

## 2023-07-18 MED ORDER — OMEGA-3-ACID ETHYL ESTERS 1 G PO CAPS: 2.0000 g | ORAL_CAPSULE | Freq: Two times a day (BID) | ORAL | 3 refills | Status: DC

## 2023-07-18 MED ORDER — CETIRIZINE HCL 10 MG PO TABS
10.0000 mg | ORAL_TABLET | Freq: Every day | ORAL | 3 refills | Status: AC
  Filled 2023-07-18: qty 90, 90d supply, fill #0
  Filled 2023-12-19: qty 90, 90d supply, fill #1
  Filled 2024-03-20: qty 90, 90d supply, fill #2

## 2023-09-08 ENCOUNTER — Ambulatory Visit: Admitting: Family Medicine

## 2023-09-15 ENCOUNTER — Encounter: Payer: Self-pay | Admitting: Family Medicine

## 2023-09-15 ENCOUNTER — Ambulatory Visit: Payer: Self-pay | Admitting: Family Medicine

## 2023-09-15 ENCOUNTER — Ambulatory Visit: Admitting: Family Medicine

## 2023-09-15 ENCOUNTER — Other Ambulatory Visit (HOSPITAL_BASED_OUTPATIENT_CLINIC_OR_DEPARTMENT_OTHER): Payer: Self-pay

## 2023-09-15 VITALS — BP 134/78 | HR 59 | Temp 97.9°F | Ht 64.75 in | Wt 189.2 lb

## 2023-09-15 DIAGNOSIS — I10 Essential (primary) hypertension: Secondary | ICD-10-CM | POA: Diagnosis not present

## 2023-09-15 DIAGNOSIS — E782 Mixed hyperlipidemia: Secondary | ICD-10-CM

## 2023-09-15 LAB — LIPID PANEL
Cholesterol: 152 mg/dL (ref 0–200)
HDL: 33.2 mg/dL — ABNORMAL LOW (ref >39.00)
LDL Cholesterol: 45 mg/dL (ref 0–99)
NonHDL: 119.23
Total CHOL/HDL Ratio: 5
Triglycerides: 373 mg/dL — ABNORMAL HIGH (ref 0.0–149.0)
VLDL: 74.6 mg/dL — ABNORMAL HIGH (ref 0.0–40.0)

## 2023-09-15 MED ORDER — FENOFIBRATE 145 MG PO TABS
145.0000 mg | ORAL_TABLET | Freq: Every day | ORAL | 3 refills | Status: AC
  Filled 2023-09-15: qty 90, 90d supply, fill #0
  Filled 2023-12-19: qty 90, 90d supply, fill #1

## 2023-09-15 MED ORDER — OLMESARTAN MEDOXOMIL-HCTZ 40-25 MG PO TABS
1.0000 | ORAL_TABLET | Freq: Every day | ORAL | 3 refills | Status: AC
  Filled 2023-09-15: qty 90, 90d supply, fill #0
  Filled 2023-12-19: qty 90, 90d supply, fill #1
  Filled 2024-03-20: qty 90, 90d supply, fill #2

## 2023-09-15 NOTE — Assessment & Plan Note (Signed)
 Continue atorvastatin  20 mg daily. I will check lipids today. If these are not at goal, esp. related to triglycerides, will consider adding back the fenofibrate  145 mg daily.

## 2023-09-15 NOTE — Assessment & Plan Note (Signed)
 Blood pressure is at goal int he clinic, but home BPs have been high. Continue olmesartan -HCTZ (Benicar  HCT) 40-25 mg daily. I recommend he also start taking the amlodipine  10 mg daily that he has at home.

## 2023-09-15 NOTE — Progress Notes (Signed)
 [facility] [address] [place] [address] Dept: [phone] Dept Fax: [phone]  Chronic Care Office Visit  Subjective:    Patient ID: Cody Lindsey, male    DOB: [birth year], 63 y.o..   MRN: [id number]  Chief Complaint  Patient presents with   Hypertension    4  month f/u HTN.    History of Present Illness:  Patient is in today for reassessment of chronic medical issues.  Cody Lindsey has a history of hypertension. He had been managed on amlodipine  (Caduet- combination with atorvastatin ) 10-10 mg daily and olmesartan -HCTZ (Benicar  HCT) 40-25 mg daily. After his last visit, his lipids were not at goal, so I switched him from the Caduet to separate amlodipine  and atorvastatin   pills. Apparently, he has not been taking the amlodipine  10 mg daily. He has noted elevated blood pressures when checking at home, mostly in the 140s/90s.   Cody Lindsey has a history of hyperlipidemia. He is managed on atorvastatin  20 mg daily. This was increased after his last visit.  He had also been on fenofibrate  145 mg daily. He apparently has stopped taking this. He has a past history of triglycerides in the 900s. He and his wife note dietary changes they made to improve this.  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 No pertinent surgical history. Family History  Problem Relation Age of Onset   Childhood respiratory disease Mother    Heart disease Father    Hyperlipidemia Father    Hypertension Father    Outpatient Medications Prior to Visit  Medication Sig Dispense Refill   atorvastatin  (LIPITOR) 20 MG tablet Take 1 tablet (20 mg total) by mouth daily. 90 tablet 3    cetirizine  (ZYRTEC ) 10 MG tablet Take 1 tablet (10 mg total) by mouth daily. 90 tablet 3   cholecalciferol (VITAMIN D3) 25 MCG (1000 UNIT) tablet Take 1,000 Units by mouth daily.     cyanocobalamin (VITAMIN B12) 1000 MCG tablet Take 1,000 mcg by mouth daily.     omega-3 acid ethyl esters (LOVAZA ) 1 g capsule Take 2 capsules by mouth twice a day 360 capsule 3   sildenafil  (VIAGRA ) 100 MG tablet Take 1 tablet (100 mg total) by mouth as needed and as directed prior to coitus. 30 tablet 11   Turmeric (QC TUMERIC COMPLEX) 500 MG CAPS Take by mouth.     zinc gluconate 50 MG tablet Take 50 mg by mouth daily.     fenofibrate  (TRICOR ) 145 MG tablet Take 1 tablet (145 mg total) by mouth daily with food. 90 tablet 1   olmesartan -hydrochlorothiazide  (BENICAR  HCT) 40-25 MG tablet Take 1 tablet by mouth daily. 90 tablet 3   amLODipine  (NORVASC ) 10 MG tablet Take 1 tablet (10 mg total) by mouth daily. (Patient not taking: Reported on 09/15/2023) 90 tablet 3   doxycycline  (VIBRAMYCIN ) 100 MG capsule Take 1 capsule (100 mg total) by mouth daily with meal. 30 capsule 1   omega-3 acid ethyl esters (LOVAZA ) 1 g capsule Take 2 capsules (2 g total) by mouth 2 (two) times daily. 360 capsule 0   omega-3 acid ethyl esters (LOVAZA ) 1 g capsule Take 2 capsules (2 grams total) by mouth 2 (two) times daily. 360 capsule  3   No facility-administered medications prior to visit.   No Known Allergies Objective:   Today's Vitals   09/15/23 1041  BP: 134/78  Pulse: (!) 59  Temp: 97.9 F (36.6 C)  TempSrc: Temporal  SpO2: 98%  Weight: 189 lb 3.2 oz (85.8 kg)  Height: 5' 4.75 (1.645 m)   Body mass index is 31.73 kg/m.   General: Well developed, well nourished. No acute distress. Psych: Alert and oriented. Normal mood and affect.  Health Maintenance Due  Topic Date Due   HIV Screening  Never done   Colonoscopy  Never done   Lab Results Last lipids Lab Results  Component Value Date   CHOL 218 (H) 05/05/2023    HDL 43.20 05/05/2023   LDLCALC 131 (H) 05/05/2023   TRIG 216.0 (H) 05/05/2023   CHOLHDL 5 05/05/2023      Assessment & Plan:   Problem List Items Addressed This Visit       Cardiovascular and Mediastinum   Essential hypertension   Blood pressure is at goal int he clinic, but home BPs have been high. Continue olmesartan -HCTZ (Benicar  HCT) 40-25 mg daily. I recommend he also start taking the amlodipine  10 mg daily that he has at home.      Relevant Medications   olmesartan -hydrochlorothiazide  (BENICAR  HCT) 40-25 MG tablet     Other   Mixed hyperlipidemia - Primary   Continue atorvastatin  20 mg daily. I will check lipids today. If these are not at goal, esp. related to triglycerides, will consider adding back the fenofibrate  145 mg daily.      Relevant Medications   olmesartan -hydrochlorothiazide  (BENICAR  HCT) 40-25 MG tablet   Other Relevant Orders   Lipid panel    Return in about 3 months (around 12/16/2023) for Reassessment.   Garnette CHRISTELLA Simpler, MD

## 2023-11-04 ENCOUNTER — Other Ambulatory Visit (HOSPITAL_BASED_OUTPATIENT_CLINIC_OR_DEPARTMENT_OTHER): Payer: Self-pay

## 2023-11-19 ENCOUNTER — Other Ambulatory Visit (HOSPITAL_BASED_OUTPATIENT_CLINIC_OR_DEPARTMENT_OTHER): Payer: Self-pay

## 2023-12-19 ENCOUNTER — Other Ambulatory Visit (HOSPITAL_BASED_OUTPATIENT_CLINIC_OR_DEPARTMENT_OTHER): Payer: Self-pay

## 2023-12-19 ENCOUNTER — Other Ambulatory Visit: Payer: Self-pay

## 2023-12-23 ENCOUNTER — Other Ambulatory Visit: Payer: Self-pay

## 2023-12-23 ENCOUNTER — Other Ambulatory Visit (HOSPITAL_BASED_OUTPATIENT_CLINIC_OR_DEPARTMENT_OTHER): Payer: Self-pay

## 2024-01-13 ENCOUNTER — Other Ambulatory Visit: Payer: Self-pay

## 2024-01-13 NOTE — Telephone Encounter (Signed)
 Spoke to patient's wife and was notified that last RX was from previous provider.  He is almost out of medication and they are aware that it will be  Monday 01/19/24 before provider is back in the office.  Dm/cma

## 2024-01-13 NOTE — Addendum Note (Signed)
 Addended by: KYM KARNA CROME on: 01/13/2024 02:19 PM   Modules accepted: Orders

## 2024-01-13 NOTE — Telephone Encounter (Signed)
 Left VM to rtn call to at number provided. Dm/cma

## 2024-01-13 NOTE — Telephone Encounter (Signed)
 Copied from CRM #[id number]. Topic: Clinical - Prescription Issue >> Jan 13, 2024  1:02 PM Nessti S wrote: Reason for CRM: pt wife called about pt refill of omega-3 acid ethyl esters (LOVAZA ) 1 g capsule. Pt would like a call back concerning the discontinue of meds. Call back number [phone] and can leave VM.

## 2024-01-16 ENCOUNTER — Other Ambulatory Visit (HOSPITAL_BASED_OUTPATIENT_CLINIC_OR_DEPARTMENT_OTHER): Payer: Self-pay

## 2024-01-19 ENCOUNTER — Other Ambulatory Visit: Payer: Self-pay | Admitting: Family Medicine

## 2024-01-19 ENCOUNTER — Other Ambulatory Visit (HOSPITAL_BASED_OUTPATIENT_CLINIC_OR_DEPARTMENT_OTHER): Payer: Self-pay

## 2024-01-19 ENCOUNTER — Other Ambulatory Visit: Payer: Self-pay

## 2024-01-19 DIAGNOSIS — E782 Mixed hyperlipidemia: Secondary | ICD-10-CM

## 2024-01-19 MED ORDER — OMEGA-3-ACID ETHYL ESTERS 1 G PO CAPS
1.0000 g | ORAL_CAPSULE | Freq: Two times a day (BID) | ORAL | 3 refills | Status: AC
  Filled 2024-01-19: qty 180, 90d supply, fill #0
  Filled 2024-03-20: qty 180, 90d supply, fill #1

## 2024-01-19 NOTE — Addendum Note (Signed)
 Addended by: THEDORA GARNETTE HERO on: 01/19/2024 05:17 PM   Modules accepted: Orders

## 2024-03-20 ENCOUNTER — Other Ambulatory Visit (HOSPITAL_BASED_OUTPATIENT_CLINIC_OR_DEPARTMENT_OTHER): Payer: Self-pay

## 2024-03-21 ENCOUNTER — Other Ambulatory Visit: Payer: Self-pay

## 2024-03-22 ENCOUNTER — Other Ambulatory Visit: Payer: Self-pay

## 2024-03-22 ENCOUNTER — Other Ambulatory Visit (HOSPITAL_BASED_OUTPATIENT_CLINIC_OR_DEPARTMENT_OTHER): Payer: Self-pay
